# Patient Record
Sex: Male | Born: 1960 | Race: Black or African American | Hispanic: No | Marital: Married | Smoking: Current every day smoker
Health system: Southern US, Community
[De-identification: ages and names within clinical notes are randomized; demographics above are authoritative.]

## PROBLEM LIST (undated history)

## (undated) HISTORY — PX: HEMORRHOID SURGERY: SHX153

---

## 2005-07-18 ENCOUNTER — Emergency Department: Payer: Self-pay | Admitting: Emergency Medicine

## 2014-04-15 ENCOUNTER — Emergency Department: Payer: Self-pay | Admitting: Emergency Medicine

## 2014-04-15 LAB — CBC WITH DIFFERENTIAL/PLATELET
Basophil #: 0.1 10*3/uL (ref 0.0–0.1)
Basophil %: 0.8 %
EOS PCT: 1.5 %
Eosinophil #: 0.1 10*3/uL (ref 0.0–0.7)
HCT: 45.2 % (ref 40.0–52.0)
HGB: 14.6 g/dL (ref 13.0–18.0)
LYMPHS ABS: 1.9 10*3/uL (ref 1.0–3.6)
Lymphocyte %: 21.4 %
MCH: 32.7 pg (ref 26.0–34.0)
MCHC: 32.4 g/dL (ref 32.0–36.0)
MCV: 101 fL — AB (ref 80–100)
MONOS PCT: 8.9 %
Monocyte #: 0.8 x10 3/mm (ref 0.2–1.0)
NEUTROS ABS: 6.1 10*3/uL (ref 1.4–6.5)
Neutrophil %: 67.4 %
Platelet: 192 10*3/uL (ref 150–440)
RBC: 4.47 10*6/uL (ref 4.40–5.90)
RDW: 16.1 % — ABNORMAL HIGH (ref 11.5–14.5)
WBC: 9 10*3/uL (ref 3.8–10.6)

## 2014-04-15 LAB — URINALYSIS, COMPLETE
BILIRUBIN, UR: NEGATIVE
BLOOD: NEGATIVE
Bacteria: NONE SEEN
Glucose,UR: NEGATIVE mg/dL (ref 0–75)
Ketone: NEGATIVE
LEUKOCYTE ESTERASE: NEGATIVE
Nitrite: NEGATIVE
Ph: 5 (ref 4.5–8.0)
Protein: NEGATIVE
Specific Gravity: 1.017 (ref 1.003–1.030)

## 2014-04-15 LAB — COMPREHENSIVE METABOLIC PANEL
Albumin: 4.1 g/dL (ref 3.4–5.0)
Alkaline Phosphatase: 55 U/L (ref 46–116)
Anion Gap: 12 (ref 7–16)
BILIRUBIN TOTAL: 0.5 mg/dL (ref 0.2–1.0)
BUN: 3 mg/dL — ABNORMAL LOW (ref 7–18)
CREATININE: 0.81 mg/dL (ref 0.60–1.30)
Calcium, Total: 9.6 mg/dL (ref 8.5–10.1)
Chloride: 106 mmol/L (ref 98–107)
Co2: 23 mmol/L (ref 21–32)
EGFR (African American): >60
GLUCOSE: 104 mg/dL — AB (ref 65–99)
OSMOLALITY: 278 (ref 275–301)
POTASSIUM: 4.4 mmol/L (ref 3.5–5.1)
SGOT(AST): 26 U/L (ref 15–37)
SGPT (ALT): 15 U/L (ref 14–63)
SODIUM: 141 mmol/L (ref 136–145)
TOTAL PROTEIN: 7.4 g/dL (ref 6.4–8.2)

## 2014-04-15 LAB — TROPONIN I

## 2014-04-15 LAB — LIPASE, BLOOD: Lipase: 77 U/L (ref 73–393)

## 2016-08-13 ENCOUNTER — Encounter: Payer: Self-pay | Admitting: Emergency Medicine

## 2016-08-13 ENCOUNTER — Emergency Department
Admission: EM | Admit: 2016-08-13 | Discharge: 2016-08-13 | Disposition: A | Discharge status: Home / Self Care | Payer: BLUE CROSS/BLUE SHIELD | Attending: Emergency Medicine | Admitting: Emergency Medicine

## 2016-08-13 DIAGNOSIS — F17200 Nicotine dependence, unspecified, uncomplicated: Secondary | ICD-10-CM | POA: Insufficient documentation

## 2016-08-13 DIAGNOSIS — K047 Periapical abscess without sinus: Secondary | ICD-10-CM | POA: Diagnosis not present

## 2016-08-13 DIAGNOSIS — J029 Acute pharyngitis, unspecified: Secondary | ICD-10-CM | POA: Diagnosis present

## 2016-08-13 LAB — CBC WITH DIFFERENTIAL/PLATELET
Basophils Absolute: 0.1 10*3/uL (ref 0–0.1)
Basophils Relative: 1 %
Eosinophils Absolute: 0.1 10*3/uL (ref 0–0.7)
Eosinophils Relative: 1 %
HCT: 40.5 % (ref 40.0–52.0)
Hemoglobin: 13.5 g/dL (ref 13.0–18.0)
Lymphocytes Relative: 12 %
Lymphs Abs: 1.4 10*3/uL (ref 1.0–3.6)
MCH: 31.8 pg (ref 26.0–34.0)
MCHC: 33.3 g/dL (ref 32.0–36.0)
MCV: 95.4 fL (ref 80.0–100.0)
Monocytes Absolute: 0.9 10*3/uL (ref 0.2–1.0)
Monocytes Relative: 8 %
Neutro Abs: 9.4 10*3/uL — ABNORMAL HIGH (ref 1.4–6.5)
Neutrophils Relative %: 78 %
Platelets: 217 10*3/uL (ref 150–440)
RBC: 4.25 MIL/uL — ABNORMAL LOW (ref 4.40–5.90)
RDW: 16 % — ABNORMAL HIGH (ref 11.5–14.5)
WBC: 11.8 10*3/uL — ABNORMAL HIGH (ref 3.8–10.6)

## 2016-08-13 LAB — COMPREHENSIVE METABOLIC PANEL
ALBUMIN: 4.3 g/dL (ref 3.5–5.0)
ALT: 13 U/L — ABNORMAL LOW (ref 17–63)
AST: 20 U/L (ref 15–41)
Alkaline Phosphatase: 50 U/L (ref 38–126)
Anion gap: 9 (ref 5–15)
BILIRUBIN TOTAL: 1 mg/dL (ref 0.3–1.2)
BUN: 9 mg/dL (ref 6–20)
CO2: 24 mmol/L (ref 22–32)
Calcium: 9 mg/dL (ref 8.9–10.3)
Chloride: 103 mmol/L (ref 101–111)
Creatinine, Ser: 0.8 mg/dL (ref 0.61–1.24)
GFR calc Af Amer: >60 mL/min (ref >60)
Glucose, Bld: 83 mg/dL (ref 65–99)
POTASSIUM: 4.2 mmol/L (ref 3.5–5.1)
Sodium: 136 mmol/L (ref 135–145)
TOTAL PROTEIN: 7.5 g/dL (ref 6.5–8.1)

## 2016-08-13 LAB — POCT RAPID STREP A: Streptococcus, Group A Screen (Direct): NEGATIVE

## 2016-08-13 MED ORDER — PENICILLIN V POTASSIUM 500 MG PO TABS: 500.0000 mg | ORAL_TABLET | Freq: Four times a day (QID) | ORAL | 0 refills | Status: DC

## 2016-08-13 MED ORDER — SODIUM CHLORIDE 0.9 % IV SOLN
3.0000 g | Freq: Once | INTRAVENOUS | Status: AC
  Administered 2016-08-13: 3 g via INTRAVENOUS
  Filled 2016-08-13: qty 3

## 2016-08-13 MED ORDER — TRAMADOL HCL 50 MG PO TABS: 50.0000 mg | ORAL_TABLET | Freq: Four times a day (QID) | ORAL | 0 refills | Status: AC | PRN

## 2016-08-13 NOTE — Discharge Instructions (Signed)
Been taking antibiotics as directed. Pen-Vee K 4 times a day for the next 7 days. Tramadol every 6 hours as needed for pain. You may also take Tylenol or ibuprofen as needed for pain and inflammation. Tuesday call to make an appointment with a dentist of your choice.

## 2016-08-13 NOTE — ED Provider Notes (Signed)
[facility] Emergency Department Provider Note ____________________________________________  Time seen: 8:40 AM  I have reviewed the triage vital signs and the nursing notes.  HISTORY  Chief Complaint  Sore Throat   HPI Johnathan Fowler is a 56 y.o. male is here with complaint of sore throat for the last 3 days. Patient states he has not taken anything over-the-counter and that today his throat is much worse. He also cannot open his mouth due to pain. He is unaware of any fever or chills. He states he is aware that he has a tooth that is bothering him but his throat is hurting worse. He still continues to drink fluids without any difficulty. Eating has become difficult study secondary to pain. Currently he rates his pain as 9/10.   History reviewed. No pertinent past medical history.  There are no active problems to display for this patient.   History reviewed. No pertinent surgical history.  Prior to Admission medications   Medication Sig Start Date End Date Taking? Authorizing Provider  penicillin v potassium (VEETID) 500 MG tablet Take 1 tablet (500 mg total) by mouth 4 (four) times daily. 08/13/16   Tommi Rumps, PA-C  traMADol (ULTRAM) 50 MG tablet Take 1 tablet (50 mg total) by mouth every 6 (six) hours as needed. 08/13/16 08/13/17  Tommi Rumps, PA-C    Allergies Patient has no known allergies.  History reviewed. No pertinent family history.  Social History Social History  Substance Use Topics  . Smoking status: Current Every Day Smoker  . Smokeless tobacco: Never Used  . Alcohol use Yes    Review of Systems  Constitutional: Negative for fever. Eyes: Negative for visual changes. ENT: Positive for sore throat. Positive for dental pain. Cardiovascular: Negative for chest pain. Respiratory: Negative for shortness of breath. Musculoskeletal: Negative for back pain. Skin: Negative for rash. Neurological: Negative for headaches, focal  weakness or numbness. ____________________________________________  PHYSICAL EXAM:  VITAL SIGNS: ED Triage Vitals [08/13/16 0821]  Enc Vitals Group     BP (!) 130/92     Pulse Rate 92     Resp 18     Temp 99.2 F (37.3 C)     Temp Source Oral     SpO2 100 %     Weight 120 lb (54.4 kg)     Height 5\' 11"  (1.803 m)     Head Circumference      Peak Flow      Pain Score 9     Pain Loc      Pain Edu?      Excl. in GC?     Constitutional: Alert and oriented. Well appearing and in no distress. Head: Normocephalic and atraumatic. Eyes: Conjunctivae are normal.  Ears: Canals clear. TMs intact bilaterally. Nose: No congestion Mouth/Throat: Mucous membranes are moist. Right lower posterior molar and gum surrounding is edematous. Patient is unable to open his mouth fully secondary to pain. No tonsillar enlargement is seen and uvula is midline. Neck: Supple.  Hematological/Lymphatic/Immunological: No cervical lymphadenopathy. Cardiovascular: Normal rate, regular rhythm. Normal distal pulses. Respiratory: Normal respiratory effort. No wheezes/rales/rhonchi. Musculoskeletal: Nontender with normal range of motion in all extremities.  Neurologic:  Normal gait without ataxia. Normal speech and language. No gross focal neurologic deficits are appreciated. Skin:  Skin is warm, dry and intact. No rash noted. Psychiatric: Mood and affect are normal. Patient exhibits appropriate insight and judgment.     INITIAL IMPRESSION / ASSESSMENT AND PLAN / ED COURSE  Patient improved after getting Unasyn 3 g IV and was able to open his mouth with less difficulty. Dr. Mayford KnifeWilliams was in to see the patient also and at that time there was active purulent drainage from the right posterior lower molar on the medial aspect. Patient was discharged with a prescription for tramadol 1 every 6 hours as needed for pain. Pen-Vee K 500 mg 4 times a day for 7 days. He is to follow-up with a dentist.     ____________________________________________  FINAL CLINICAL IMPRESSION(S) / ED DIAGNOSES  Final diagnoses:  Dental abscess     Tommi RumpsSummers, Rhonda L, PA-C 08/13/16 1339    Arnaldo NatalMalinda, Paul F, MD 08/13/16 [phone]

## 2016-08-13 NOTE — ED Triage Notes (Signed)
Pt to ed with c/o sore throat x 3 days.  

## 2016-08-13 NOTE — ED Notes (Signed)
See triage note   States he developed sore throat 3 days ago  Increased pain with swallowing  Also now has pain to right gumline since last pm..afebrile on arrival

## 2016-08-13 NOTE — ED Notes (Signed)
D&C IV 

## 2017-02-24 ENCOUNTER — Emergency Department
Admission: EM | Admit: 2017-02-24 | Discharge: 2017-02-24 | Disposition: A | Discharge status: Home / Self Care | Payer: BLUE CROSS/BLUE SHIELD | Attending: Emergency Medicine | Admitting: Emergency Medicine

## 2017-02-24 ENCOUNTER — Other Ambulatory Visit: Payer: Self-pay

## 2017-02-24 ENCOUNTER — Encounter: Payer: Self-pay | Admitting: Emergency Medicine

## 2017-02-24 DIAGNOSIS — F17200 Nicotine dependence, unspecified, uncomplicated: Secondary | ICD-10-CM | POA: Diagnosis not present

## 2017-02-24 DIAGNOSIS — L139 Bullous disorder, unspecified: Secondary | ICD-10-CM | POA: Diagnosis not present

## 2017-02-24 DIAGNOSIS — R21 Rash and other nonspecific skin eruption: Secondary | ICD-10-CM | POA: Diagnosis present

## 2017-02-24 MED ORDER — CEPHALEXIN 500 MG PO CAPS
500.0000 mg | ORAL_CAPSULE | Freq: Four times a day (QID) | ORAL | 0 refills | Status: DC
Start: 2017-02-24 — End: 2017-10-29

## 2017-02-24 MED ORDER — CLOBETASOL PROP EMOLLIENT BASE 0.05 % EX CREA: 1.0000 "application " | TOPICAL_CREAM | Freq: Two times a day (BID) | CUTANEOUS | 2 refills | Status: DC

## 2017-02-24 NOTE — ED Provider Notes (Signed)
[facility] Emergency Department Provider Note  ____________________________________________  Time seen: Approximately 4:19 PM  I have reviewed the triage vital signs and the nursing notes.   HISTORY  Chief Complaint Rash    HPI Johnathan Fowler is a 56 y.o. male who presents emergency department complaining of multiple "bites" to his left foot, and back.  Patient reports that he first noticed these areas 3 days ago.  He states that initially developed large blisters, that ruptures, but drains.  He has several areas that have opened, and are crusty.  Patient denies any history of same.  Patient reports that he is staying in a boardinghouse that he believes has bedbugs.  Patient has not seen any bedbugs, bites her only to the left foot and back.  He has not tried any medications prior to arrival.  He denies any headache, neck pain, neck stiffness, chest pain, shortness of breath, abdominal pain, nausea vomiting, diarrhea or constipation.  History reviewed. No pertinent past medical history.  There are no active problems to display for this patient.   History reviewed. No pertinent surgical history.  Prior to Admission medications   Medication Sig Start Date End Date Taking? Authorizing Provider  cephALEXin (KEFLEX) 500 MG capsule Take 1 capsule (500 mg total) by mouth 4 (four) times daily. 02/24/17   Everado Pillsbury, Delorise RoyalsJonathan D, PA-C  Clobetasol Prop Emollient Base (CLOBETASOL PROPIONATE E) 0.05 % emollient cream Apply 1 application topically 2 (two) times daily. 02/24/17   Avonte Sensabaugh, Delorise RoyalsJonathan D, PA-C  penicillin v potassium (VEETID) 500 MG tablet Take 1 tablet (500 mg total) by mouth 4 (four) times daily. 08/13/16   Tommi RumpsSummers, Rhonda L, PA-C  traMADol (ULTRAM) 50 MG tablet Take 1 tablet (50 mg total) by mouth every 6 (six) hours as needed. 08/13/16 08/13/17  Tommi RumpsSummers, Rhonda L, PA-C    Allergies Patient has no known allergies.  History reviewed. No pertinent family  history.  Social History Social History   Tobacco Use  . Smoking status: Current Every Day Smoker  . Smokeless tobacco: Never Used  Substance Use Topics  . Alcohol use: Yes  . Drug use: No     Review of Systems  Constitutional: No fever/chills Eyes: No visual changes. Cardiovascular: no chest pain. Respiratory: no cough. No SOB. Gastrointestinal: No abdominal pain.  No nausea, no vomiting.  No diarrhea.  No constipation. Musculoskeletal: Negative for musculoskeletal pain. Skin: Positive for blistering lesions that rupture and drain pustular material and leave scabbing lesions to left foot and back Neurological: Negative for headaches, focal weakness or numbness. 10-point ROS otherwise negative.  ____________________________________________   PHYSICAL EXAM:  VITAL SIGNS: ED Triage Vitals [02/24/17 1616]  Enc Vitals Group     BP      Pulse      Resp      Temp      Temp src      SpO2      Weight 130 lb (59 kg)     Height 5\' 11"  (1.803 m)     Head Circumference      Peak Flow      Pain Score 8     Pain Loc      Pain Edu?      Excl. in GC?      Constitutional: Alert and oriented. Well appearing and in no acute distress. Eyes: Conjunctivae are normal. PERRL. EOMI. Head: Atraumatic. Neck: No stridor.    Cardiovascular: Normal rate, regular rhythm. Normal S1 and S2.  Good  peripheral circulation. Respiratory: Normal respiratory effort without tachypnea or retractions. Lungs CTAB. Good air entry to the bases with no decreased or absent breath sounds. Musculoskeletal: Full range of motion to all extremities. No gross deformities appreciated. Neurologic:  Normal speech and language. No gross focal neurologic deficits are appreciated.  Skin:  Skin is warm, dry and intact. No rash noted.  Multiple bullous lesions noted to the dorsal aspect of the left foot.  4 have ruptured and are flat, one is draining, one is intact, one has left scarring/scabbing lesion.  No surrounding  cellulitis.  Patient's back has multiple stages of bullous lesions scattered from shoulders to buttocks.  A few scabbing lesions, a few draining pustular material, however most are in stages of bullous formation. Psychiatric: Mood and affect are normal. Speech and behavior are normal. Patient exhibits appropriate insight and judgement.   ____________________________________________   LABS (all labs ordered are listed, but only abnormal results are displayed)  Labs Reviewed - No data to display ____________________________________________  EKG   ____________________________________________  RADIOLOGY   No results found.  ____________________________________________    PROCEDURES  Procedure(s) performed:    Procedures    Medications - No data to display   ____________________________________________   INITIAL IMPRESSION / ASSESSMENT AND PLAN / ED COURSE  Pertinent labs & imaging results that were available during my care of the patient were reviewed by me and considered in my medical decision making (see chart for details).  Review of the [place] CSRS was performed in accordance of the [facility] prior to dispensing any controlled drugs.     Patient's diagnosis is consistent with bullous dermatitis.  Patient has symptoms and rash consistent with bullous impetigo versus bullous pemphigoid.  Patient has had no history of same.  No significant illness to trigger autoimmune condition at this time.  At this time, it is hard to determine whether this is pemphigoid versus impetigo based bullous formation.  As such, patient will be treated with both oral antibiotics as well as topical steroids.  If patient's symptoms resolve, likely impetigo based symptoms.  If the lesions do not resolve, it is likely pemphigoid and patient has been instructed to follow-up with dermatology for further non-biologic agents to control outbreak.. Patient will be discharged home with prescriptions for Keflex  and clobetasol topical. Patient is to follow up with dermatology as needed or otherwise directed. Patient is given ED precautions to return to the ED for any worsening or new symptoms.     ____________________________________________  FINAL CLINICAL IMPRESSION(S) / ED DIAGNOSES  Final diagnoses:  Bullous dermatitis      NEW MEDICATIONS STARTED DURING THIS VISIT:  ED Discharge Orders        Ordered    cephALEXin (KEFLEX) 500 MG capsule  4 times daily     02/24/17 1636    Clobetasol Prop Emollient Base (CLOBETASOL PROPIONATE E) 0.05 % emollient cream  2 times daily     02/24/17 1636          This chart was dictated using voice recognition software/Dragon. Despite best efforts to proofread, errors can occur which can change the meaning. Any change was purely unintentional.    Racheal PatchesCuthriell, Caly Pellum D, PA-C 02/24/17 1637    Sharman CheekStafford, Phillip, MD 02/27/17 [phone]
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
## 2017-10-29 ENCOUNTER — Other Ambulatory Visit: Payer: Self-pay

## 2017-10-29 ENCOUNTER — Emergency Department: Payer: BLUE CROSS/BLUE SHIELD

## 2017-10-29 ENCOUNTER — Emergency Department
Admission: EM | Admit: 2017-10-29 | Discharge: 2017-10-29 | Disposition: A | Discharge status: Home / Self Care | Payer: BLUE CROSS/BLUE SHIELD | Attending: Emergency Medicine | Admitting: Emergency Medicine

## 2017-10-29 ENCOUNTER — Encounter: Payer: Self-pay | Admitting: Emergency Medicine

## 2017-10-29 DIAGNOSIS — Y998 Other external cause status: Secondary | ICD-10-CM | POA: Insufficient documentation

## 2017-10-29 DIAGNOSIS — Y92410 Unspecified street and highway as the place of occurrence of the external cause: Secondary | ICD-10-CM | POA: Insufficient documentation

## 2017-10-29 DIAGNOSIS — S161XXA Strain of muscle, fascia and tendon at neck level, initial encounter: Secondary | ICD-10-CM | POA: Insufficient documentation

## 2017-10-29 DIAGNOSIS — S39012A Strain of muscle, fascia and tendon of lower back, initial encounter: Secondary | ICD-10-CM

## 2017-10-29 DIAGNOSIS — Y9389 Activity, other specified: Secondary | ICD-10-CM | POA: Insufficient documentation

## 2017-10-29 DIAGNOSIS — F17200 Nicotine dependence, unspecified, uncomplicated: Secondary | ICD-10-CM | POA: Diagnosis not present

## 2017-10-29 DIAGNOSIS — S199XXA Unspecified injury of neck, initial encounter: Secondary | ICD-10-CM | POA: Diagnosis present

## 2017-10-29 MED ORDER — KETOROLAC TROMETHAMINE 10 MG PO TABS: 10.0000 mg | ORAL_TABLET | Freq: Three times a day (TID) | ORAL | 0 refills | Status: DC

## 2017-10-29 MED ORDER — KETOROLAC TROMETHAMINE 30 MG/ML IJ SOLN
30.0000 mg | Freq: Once | INTRAMUSCULAR | Status: AC
  Administered 2017-10-29: 30 mg via INTRAMUSCULAR
  Filled 2017-10-29: qty 1

## 2017-10-29 MED ORDER — CYCLOBENZAPRINE HCL 5 MG PO TABS: 5.0000 mg | ORAL_TABLET | Freq: Three times a day (TID) | ORAL | 0 refills | Status: DC | PRN
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
## 2019-05-31 ENCOUNTER — Emergency Department: Payer: Self-pay

## 2019-05-31 ENCOUNTER — Other Ambulatory Visit: Payer: Self-pay

## 2019-05-31 ENCOUNTER — Encounter: Payer: Self-pay | Admitting: Emergency Medicine

## 2019-05-31 ENCOUNTER — Emergency Department
Admission: EM | Admit: 2019-05-31 | Discharge: 2019-05-31 | Disposition: A | Discharge status: Home / Self Care | Payer: Self-pay | Attending: Emergency Medicine | Admitting: Emergency Medicine

## 2019-05-31 DIAGNOSIS — F17200 Nicotine dependence, unspecified, uncomplicated: Secondary | ICD-10-CM | POA: Insufficient documentation

## 2019-05-31 DIAGNOSIS — M10071 Idiopathic gout, right ankle and foot: Secondary | ICD-10-CM | POA: Insufficient documentation

## 2019-05-31 LAB — CBC
HCT: 36.1 % — ABNORMAL LOW (ref 39.0–52.0)
Hemoglobin: 11.7 g/dL — ABNORMAL LOW (ref 13.0–17.0)
MCH: 32.5 pg (ref 26.0–34.0)
MCHC: 32.4 g/dL (ref 30.0–36.0)
MCV: 100.3 fL — ABNORMAL HIGH (ref 80.0–100.0)
Platelets: 202 10*3/uL (ref 150–400)
RBC: 3.6 MIL/uL — ABNORMAL LOW (ref 4.22–5.81)
RDW: 14.4 % (ref 11.5–15.5)
WBC: 8.6 10*3/uL (ref 4.0–10.5)
nRBC: 0 % (ref 0.0–0.2)

## 2019-05-31 LAB — URIC ACID: Uric Acid, Serum: 9.5 mg/dL — ABNORMAL HIGH (ref 3.7–8.6)

## 2019-05-31 LAB — SEDIMENTATION RATE: Sed Rate: 9 mm/hr (ref 0–20)

## 2019-05-31 MED ORDER — OXYCODONE-ACETAMINOPHEN 5-325 MG PO TABS
1.0000 | ORAL_TABLET | Freq: Once | ORAL | Status: AC
  Administered 2019-05-31: 1 via ORAL
  Filled 2019-05-31: qty 1

## 2019-05-31 MED ORDER — INDOMETHACIN 50 MG PO CAPS: 50.0000 mg | ORAL_CAPSULE | Freq: Three times a day (TID) | ORAL | 1 refills | Status: DC

## 2019-05-31 MED ORDER — IBUPROFEN 600 MG PO TABS
600.0000 mg | ORAL_TABLET | Freq: Once | ORAL | Status: AC
  Administered 2019-05-31: 600 mg via ORAL
  Filled 2019-05-31: qty 1

## 2019-05-31 MED ORDER — TRAMADOL HCL 50 MG PO TABS: 50.0000 mg | ORAL_TABLET | Freq: Four times a day (QID) | ORAL | 0 refills | Status: DC | PRN

## 2019-05-31 MED ORDER — LIDOCAINE 5 % EX PTCH
1.0000 | MEDICATED_PATCH | Freq: Once | CUTANEOUS | Status: DC
  Administered 2019-05-31: 1 via TRANSDERMAL
  Filled 2019-05-31: qty 1

## 2019-05-31 NOTE — ED Provider Notes (Signed)
[facility] Emergency Department Provider Note   ____________________________________________   First MD Initiated Contact with Patient 05/31/19 1237     (approximate)  I have reviewed the triage vital signs and the nursing notes.   HISTORY  Chief Complaint Foot Pain    HPI Johnathan Fowler is a 59 y.o. male patient presents with acute onset of right great toe pain which started yesterday afternoon.  No provocative incident for complaint.  Patient rates pain a 10/10.  Patient described the pain as "achy".  No palliative measure for complaint.         History reviewed. No pertinent past medical history.  There are no problems to display for this patient.   History reviewed. No pertinent surgical history.  Prior to Admission medications   Medication Sig Start Date End Date Taking? Authorizing Provider  indomethacin (INDOCIN) 50 MG capsule Take 1 capsule (50 mg total) by mouth 3 (three) times daily with meals. 05/31/19   Sable Feil, PA-C  traMADol (ULTRAM) 50 MG tablet Take 1 tablet (50 mg total) by mouth every 6 (six) hours as needed for moderate pain. 05/31/19   Sable Feil, PA-C    Allergies Patient has no known allergies.  No family history on file.  Social History Social History   Tobacco Use  . Smoking status: Current Every Day Smoker  . Smokeless tobacco: Never Used  Substance Use Topics  . Alcohol use: Yes  . Drug use: No    Review of Systems Constitutional: No fever/chills Eyes: No visual changes. ENT: No sore throat. Cardiovascular: Denies chest pain. Respiratory: Denies shortness of breath. Gastrointestinal: No abdominal pain.  No nausea, no vomiting.  No diarrhea.  No constipation. Genitourinary: Negative for dysuria. Musculoskeletal: Right great toe pain. Skin: Negative for rash.  ____________________________________________   PHYSICAL EXAM:  VITAL SIGNS: ED Triage Vitals  Enc Vitals Group     BP    Pulse      Resp      Temp      Temp src      SpO2      Weight      Height      Head Circumference      Peak Flow      Pain Score      Pain Loc      Pain Edu?      Excl. in [place]?     Constitutional: Alert and oriented. Well appearing and in no acute distress. Cardiovascular: Normal rate, regular rhythm. Grossly normal heart sounds.  Good peripheral circulation. Respiratory: Normal respiratory effort.  No retractions. Lungs CTAB. Musculoskeletal: No lower extremity tenderness nor edema.  No joint effusions. Neurologic:  Normal speech and language. No gross focal neurologic deficits are appreciated. No gait instability. Skin:  Skin is warm, dry and intact. No rash noted. Psychiatric: Mood and affect are normal. Speech and behavior are normal.  ____________________________________________   LABS (all labs ordered are listed, but only abnormal results are displayed)  Labs Reviewed  URIC ACID - Abnormal; Notable for the following components:      Result Value   Uric Acid, Serum 9.5 (*)    All other components within normal limits  CBC - Abnormal; Notable for the following components:   RBC 3.60 (*)    Hemoglobin 11.7 (*)    HCT 36.1 (*)    MCV 100.3 (*)    All other components within normal limits  SEDIMENTATION RATE   ____________________________________________  EKG   ____________________________________________  RADIOLOGY  ED MD interpretation:   Official radiology report(s): DG Toe Great Right  Result Date: 05/31/2019 CLINICAL DATA:  Right toe pain and swelling. No known injury EXAM: RIGHT GREAT TOE COMPARISON:  None. FINDINGS: There is no evidence of fracture or dislocation. No erosion. There is no evidence of arthropathy or other focal bone abnormality. Mild soft tissue prominence overlying the medial aspect of the first MTP joint. IMPRESSION: Mild soft tissue prominence overlying the medial aspect of the first MTP joint. No erosion or other acute osseous  abnormality. Electronically Signed   By: Duanne Guess D.O.   On: 05/31/2019 13:11    ____________________________________________   PROCEDURES  Procedure(s) performed (including Critical Care):  Procedures   ____________________________________________   INITIAL IMPRESSION / ASSESSMENT AND PLAN / ED COURSE  As part of my medical decision making, I reviewed the following data within the electronic MEDICAL RECORD NUMBER     Patient present with acute onset of right great toe pain. Discussed lab and x-ray results with Patient. Patient given discharge care instructions and work note. Take medications as directed. Establish care with the [facility].    Johnathan Fowler was evaluated in Emergency Department on 05/31/2019 for the symptoms described in the history of present illness. He was evaluated in the context of the global COVID-19 pandemic, which necessitated consideration that the patient might be at risk for infection with the SARS-CoV-2 virus that causes COVID-19. Institutional protocols and algorithms that pertain to the evaluation of patients at risk for COVID-19 are in a state of rapid change based on information released by regulatory bodies including the CDC and federal and state organizations. These policies and algorithms were followed during the patient's care in the ED.       ____________________________________________   FINAL CLINICAL IMPRESSION(S) / ED DIAGNOSES  Final diagnoses:  Acute idiopathic gout involving toe of right foot     ED Discharge Orders         Ordered    indomethacin (INDOCIN) 50 MG capsule  3 times daily with meals     05/31/19 1406    traMADol (ULTRAM) 50 MG tablet  Every 6 hours PRN     05/31/19 1406           Note:  This document was prepared using Dragon voice recognition software and may include unintentional dictation errors.    Joni Reining, PA-C 05/31/19 1412    Concha Se, MD 06/01/19 [phone]

## 2019-05-31 NOTE — ED Triage Notes (Signed)
Presents with right foot pain  Developed pain last pm  Redness and swelling noted at great toe

## 2019-05-31 NOTE — Discharge Instructions (Addendum)
Follow discharge care instruction take medication as directed. °

## 2019-06-03 NOTE — ED Notes (Signed)
Per Dr. Mayford Knife, extend work note from 3/15-3/17.

## 2019-09-17 ENCOUNTER — Telehealth: Payer: Self-pay | Admitting: General Practice

## 2019-09-17 NOTE — Telephone Encounter (Signed)
Individual has been contacted 3+ times regarding ED referral and has been given information regarding how to become a pt. No further attempts to contact individual will be made. 

## 2019-12-14 ENCOUNTER — Emergency Department
Admission: EM | Admit: 2019-12-14 | Discharge: 2019-12-14 | Disposition: A | Discharge status: Home / Self Care | Payer: Self-pay | Attending: Emergency Medicine | Admitting: Emergency Medicine

## 2019-12-14 ENCOUNTER — Emergency Department: Payer: Self-pay

## 2019-12-14 ENCOUNTER — Other Ambulatory Visit: Payer: Self-pay

## 2019-12-14 DIAGNOSIS — F17200 Nicotine dependence, unspecified, uncomplicated: Secondary | ICD-10-CM | POA: Insufficient documentation

## 2019-12-14 DIAGNOSIS — R0789 Other chest pain: Secondary | ICD-10-CM | POA: Insufficient documentation

## 2019-12-14 LAB — BASIC METABOLIC PANEL
Anion gap: 7 (ref 5–15)
BUN: 5 mg/dL — ABNORMAL LOW (ref 6–20)
CO2: 26 mmol/L (ref 22–32)
Calcium: 9.1 mg/dL (ref 8.9–10.3)
Chloride: 106 mmol/L (ref 98–111)
Creatinine, Ser: 0.75 mg/dL (ref 0.61–1.24)
GFR calc Af Amer: >60 mL/min (ref >60)
GFR calc non Af Amer: >60 mL/min (ref >60)
Glucose, Bld: 72 mg/dL (ref 70–99)
Potassium: 3.7 mmol/L (ref 3.5–5.1)
Sodium: 139 mmol/L (ref 135–145)

## 2019-12-14 LAB — CBC
HCT: 38.2 % — ABNORMAL LOW (ref 39.0–52.0)
Hemoglobin: 12.6 g/dL — ABNORMAL LOW (ref 13.0–17.0)
MCH: 31.2 pg (ref 26.0–34.0)
MCHC: 33 g/dL (ref 30.0–36.0)
MCV: 94.6 fL (ref 80.0–100.0)
Platelets: 164 10*3/uL (ref 150–400)
RBC: 4.04 MIL/uL — ABNORMAL LOW (ref 4.22–5.81)
RDW: 15.9 % — ABNORMAL HIGH (ref 11.5–15.5)
WBC: 7.6 10*3/uL (ref 4.0–10.5)
nRBC: 0 % (ref 0.0–0.2)

## 2019-12-14 LAB — TROPONIN I (HIGH SENSITIVITY)
Troponin I (High Sensitivity): 4 ng/L (ref <18)
Troponin I (High Sensitivity): 3 ng/L (ref <18)

## 2019-12-14 MED ORDER — KETOROLAC TROMETHAMINE 30 MG/ML IJ SOLN
30.0000 mg | Freq: Once | INTRAMUSCULAR | Status: AC
  Administered 2019-12-14: 19:00:00 30 mg via INTRAMUSCULAR
  Filled 2019-12-14: qty 1

## 2019-12-14 MED ORDER — ACETAMINOPHEN 500 MG PO TABS
1000.0000 mg | ORAL_TABLET | Freq: Once | ORAL | Status: AC
  Administered 2019-12-14: 19:00:00 1000 mg via ORAL
  Filled 2019-12-14: qty 2

## 2019-12-14 NOTE — ED Provider Notes (Signed)
[facility] Emergency Department Provider Note ____________________________________________   First MD Initiated Contact with Patient 12/14/19 1856     (approximate)  I have reviewed the triage vital signs and the nursing notes.  HISTORY  Chief Complaint Chest Pain   HPI Johnathan Fowler is a 59 y.o. malewho presents to the ED for evaluation of chest pain.  Chart review indicates history of gout.  No relevant medical history.  No cardiac history.  Patient reports about 4 days of constant left-sided chest pain.  He reports pain is 7/10 intensity and constant to his left chest, transiently improved with home Tylenol.  Nonradiating.  Aching in nature.  Denies any inciting injuries, denies overlying rashes.  Denies any associated vomiting, nausea, diaphoresis, syncope, shortness of breath, cough.    History reviewed. No pertinent past medical history.  There are no problems to display for this patient.   History reviewed. No pertinent surgical history.  Prior to Admission medications   Medication Sig Start Date End Date Taking? Authorizing Provider  indomethacin (INDOCIN) 50 MG capsule Take 1 capsule (50 mg total) by mouth 3 (three) times daily with meals. 05/31/19   Joni Reining, PA-C  traMADol (ULTRAM) 50 MG tablet Take 1 tablet (50 mg total) by mouth every 6 (six) hours as needed for moderate pain. 05/31/19   Joni Reining, PA-C    Allergies Patient has no known allergies.  History reviewed. No pertinent family history.  Social History Social History   Tobacco Use  . Smoking status: Current Every Day Smoker  . Smokeless tobacco: Never Used  Substance Use Topics  . Alcohol use: Yes  . Drug use: No    Review of Systems  Constitutional: No fever/chills Eyes: No visual changes. ENT: No sore throat. Cardiovascular: Positive for chest pain. Respiratory: Denies shortness of breath. Gastrointestinal: No abdominal pain.  No nausea, no  vomiting.  No diarrhea.  No constipation. Genitourinary: Negative for dysuria. Musculoskeletal: Negative for back pain. Skin: Negative for rash. Neurological: Negative for headaches, focal weakness or numbness.  ____________________________________________   PHYSICAL EXAM:  VITAL SIGNS: Vitals:   12/14/19 1915 12/14/19 1930  BP: (!) 151/79 (!) 141/84  Pulse:  69  Resp:  18  Temp:    SpO2:  100%      Constitutional: Alert and oriented. Well appearing and in no acute distress. Eyes: Conjunctivae are normal. PERRL. EOMI. Head: Atraumatic. Nose: No congestion/rhinnorhea. Mouth/Throat: Mucous membranes are moist.  Oropharynx non-erythematous. Neck: No stridor. No cervical spine tenderness to palpation. Cardiovascular: Normal rate, regular rhythm. Grossly normal heart sounds.  Good peripheral circulation. Respiratory: Normal respiratory effort.  No retractions. Lungs CTAB. Gastrointestinal: Soft , nondistended, nontender to palpation. No abdominal bruits. No CVA tenderness. Musculoskeletal: No lower extremity tenderness nor edema.  No joint effusions. No signs of acute trauma. Left-sided chest pain anteriorly is reproducible by palpation.  No overlying skin changes or signs of trauma. Neurologic:  Normal speech and language. No gross focal neurologic deficits are appreciated. No gait instability noted. Skin:  Skin is warm, dry and intact. No rash noted. Psychiatric: Mood and affect are normal. Speech and behavior are normal.  ____________________________________________   LABS (all labs ordered are listed, but only abnormal results are displayed)  Labs Reviewed  BASIC METABOLIC PANEL - Abnormal; Notable for the following components:      Result Value   BUN 5 (*)    All other components within normal limits  CBC - Abnormal; Notable for  the following components:   RBC 4.04 (*)    Hemoglobin 12.6 (*)    HCT 38.2 (*)    RDW 15.9 (*)    All other components within normal  limits  TROPONIN I (HIGH SENSITIVITY)  TROPONIN I (HIGH SENSITIVITY)   ____________________________________________  12 Lead EKG  Sinus rhythm, rate of 91 bpm.  Normal axis and intervals.  No evidence of acute ischemia. ____________________________________________  RADIOLOGY  ED MD interpretation: 2 view CXR reviewed without evidence of acute cardiopulmonary pathology.  Official radiology report(s): DG Chest 2 View  Result Date: 12/14/2019 CLINICAL DATA:  Chest pain for several days EXAM: CHEST - 2 VIEW COMPARISON:  None. FINDINGS: The heart size and mediastinal contours are within normal limits. Both lungs are clear. The visualized skeletal structures are unremarkable. IMPRESSION: No active cardiopulmonary disease. Electronically Signed   By: Alcide Clever M.D.   On: 12/14/2019 13:35    ____________________________________________   PROCEDURES and INTERVENTIONS  Procedure(s) performed (including Critical Care):  Procedures  Medications  acetaminophen (TYLENOL) tablet 1,000 mg (1,000 mg Oral Given 12/14/19 1923)  ketorolac (TORADOL) 30 MG/ML injection 30 mg (30 mg Intramuscular Given 12/14/19 1924)    ____________________________________________   MDM / ED COURSE  59 year old man without cardiac history presents with few days of constant chest pain, most consistent with muscular strain/spasm, without evidence of ACS and amenable to outpatient management.  Normal vital signs on room air.  Exam demonstrates reproducible chest pain on palpation without overlying skin changes or signs of trauma.  He is otherwise well-appearing without distress or signs of pathology.  Blood work is reassuring without acute derangements.  Nonischemic EKG and negative troponin x2.  CXR without pathology.  No evidence of shingles or ACS.  Patient has resolution of symptoms after Toradol administration.  I see no indications for further work-up in the ED or hospitalization.  Urged the patient to follow-up  with his PCP to discuss outpatient cardiac stress testing.  We discussed return precautions for the ED.  Patient medically stable for discharge home.  Clinical Course as of Dec 14 2022  Sat Dec 14, 2019  1959 Reassessed.  Patient with resolution of symptoms.  We discussed outpatient management and return precautions for the ED.  Answered questions.   [DS]    Clinical Course User Index [DS] Delton Prairie, MD     ____________________________________________   FINAL CLINICAL IMPRESSION(S) / ED DIAGNOSES  Final diagnoses:  Other chest pain     ED Discharge Orders    None       Aadvik Roker Katrinka Blazing   Note:  This document was prepared using Dragon voice recognition software and may include unintentional dictation errors.   Delton Prairie, MD 12/14/19 2025

## 2019-12-14 NOTE — ED Triage Notes (Signed)
Pt comes POV for CP in center and left chest. Denies radiation. Pt has HTN. Pain started Tuesday and states that the pain has been getting worse.

## 2019-12-14 NOTE — ED Notes (Addendum)
Patient walked out without discharge instructions. Dr. Katrinka Blazing aware.

## 2019-12-14 NOTE — Discharge Instructions (Signed)
Please take Tylenol and ibuprofen/Advil for your pain.  It is safe to take them together, or to alternate them every few hours.  Take up to 1000mg  of Tylenol at a time, up to 4 times per day.  Do not take more than 4000 mg of Tylenol in 24 hours.  For ibuprofen, take 400-600 mg, 4-5 times per day.  If you develop any worsening chest pain despite these medications, especially in combination with fevers, throwing up or passing out, please return to the ED.

## 2020-01-13 ENCOUNTER — Emergency Department
Admission: EM | Admit: 2020-01-13 | Discharge: 2020-01-13 | Disposition: A | Discharge status: Home / Self Care | Payer: Self-pay | Attending: Emergency Medicine | Admitting: Emergency Medicine

## 2020-01-13 ENCOUNTER — Other Ambulatory Visit: Payer: Self-pay

## 2020-01-13 ENCOUNTER — Encounter: Payer: Self-pay | Admitting: Emergency Medicine

## 2020-01-13 DIAGNOSIS — F17200 Nicotine dependence, unspecified, uncomplicated: Secondary | ICD-10-CM | POA: Insufficient documentation

## 2020-01-13 DIAGNOSIS — M109 Gout, unspecified: Secondary | ICD-10-CM | POA: Insufficient documentation

## 2020-01-13 MED ORDER — COLCHICINE 0.6 MG PO TABS: ORAL_TABLET | ORAL | 0 refills | Status: DC

## 2020-01-13 MED ORDER — HYDROCODONE-ACETAMINOPHEN 5-325 MG PO TABS: 1.0000 | ORAL_TABLET | Freq: Four times a day (QID) | ORAL | 0 refills | Status: AC | PRN

## 2020-01-13 NOTE — ED Triage Notes (Signed)
Pt presents to ED via POV, c/o R ankle pain, states "flared up about a month ago" with same pain. Pt ambulatory to triage with slight limp noted at this time. Pt A&O x4. Pt denies known injury, states pain re-started on Friday.

## 2020-01-13 NOTE — ED Provider Notes (Signed)
[facility] Emergency Department Provider Note ____________________________________________  Time seen: Approximately 3:00 PM  I have reviewed the triage vital signs and the nursing notes.   HISTORY  Chief Complaint Ankle Pain    HPI Johnathan Fowler is a 59 y.o. male who presents to the emergency department for evaluation and treatment of right ankle pain.  He states that approximately 1 month ago it "flared up" but got better without intervention.  Pain started again yesterday.  He does have a history of gout.  He says this is the same pain as he is experienced in the past with a gout flare.  No alleviating measures attempted prior to arrival.   History reviewed. No pertinent past medical history.  There are no problems to display for this patient.   History reviewed. No pertinent surgical history.  Prior to Admission medications   Medication Sig Start Date End Date Taking? Authorizing Provider  colchicine 0.6 MG tablet Take 2 tablets at onset of pain. If still hurting in 1 hour, take 1 tablet. No more than 3 tablets in a 24 hour period 01/13/20   Kem Boroughs B, FNP  HYDROcodone-acetaminophen (NORCO/VICODIN) 5-325 MG tablet Take 1 tablet by mouth every 6 (six) hours as needed for up to 3 days for severe pain. 01/13/20 01/16/20  Hazell Siwik, Rulon Eisenmenger B, FNP  indomethacin (INDOCIN) 50 MG capsule Take 1 capsule (50 mg total) by mouth 3 (three) times daily with meals. 05/31/19   Joni Reining, PA-C  traMADol (ULTRAM) 50 MG tablet Take 1 tablet (50 mg total) by mouth every 6 (six) hours as needed for moderate pain. 05/31/19   Joni Reining, PA-C    Allergies Patient has no known allergies.  History reviewed. No pertinent family history.  Social History Social History   Tobacco Use  . Smoking status: Current Every Day Smoker  . Smokeless tobacco: Never Used  Substance Use Topics  . Alcohol use: Yes  . Drug use: No    Review of Systems Constitutional:  Negative for fever. Cardiovascular: Negative for chest pain. Respiratory: Negative for shortness of breath. Musculoskeletal: Positive for right ankle pain. Skin: Positive for tenderness over the right ankle Neurological: Negative for decrease in sensation  ____________________________________________   PHYSICAL EXAM:  VITAL SIGNS: ED Triage Vitals  Enc Vitals Group     BP 01/13/20 1205 120/76     Pulse Rate 01/13/20 1205 79     Resp 01/13/20 1205 16     Temp 01/13/20 1205 98.6 F (37 C)     Temp Source 01/13/20 1205 Oral     SpO2 01/13/20 1205 98 %     Weight 01/13/20 1205 125 lb (56.7 kg)     Height 01/13/20 1205 5\' 11"  (1.803 m)     Head Circumference --      Peak Flow --      Pain Score 01/13/20 1210 8     Pain Loc --      Pain Edu? --      Excl. in [place]? --     Constitutional: Alert and oriented. Well appearing and in no acute distress. Eyes: Conjunctivae are clear without discharge or drainage Head: Atraumatic Neck: Supple Respiratory: No cough. Respirations are even and unlabored. Musculoskeletal: Focal tenderness, erythema, and increased skin temperature overlying the lateral malleolus of the left ankle. Neurologic: Motor and sensory function is intact. Skin: Increased skin temperature and erythema over the right lateral malleolus. Psychiatric: Affect and behavior are appropriate.  ____________________________________________  LABS (all labs ordered are listed, but only abnormal results are displayed)  Labs Reviewed - No data to display ____________________________________________  RADIOLOGY  Not indicated  I, Kem Boroughs, personally viewed and evaluated these images (plain radiographs) as part of my medical decision making, as well as reviewing the written report by the radiologist.  No results found. ____________________________________________   PROCEDURES  Procedures  ____________________________________________   INITIAL IMPRESSION /  ASSESSMENT AND PLAN / ED COURSE  Johnathan Fowler is a 59 y.o. who presents to the emergency department for treatment and evaluation of nontraumatic right ankle pain.  See HPI for further details.  Plan will be to treat him with colchicine and Norco.  Patient was encouraged to follow-up with his primary care provider or podiatrist for symptoms that do not improve with rest, elevation, and time.  He is to return to the emergency department for symptoms that change or worsen if unable to schedule an appointment.   Medications - No data to display  Pertinent labs & imaging results that were available during my care of the patient were reviewed by me and considered in my medical decision making (see chart for details).   _________________________________________   FINAL CLINICAL IMPRESSION(S) / ED DIAGNOSES  Final diagnoses:  Acute gout of right ankle, unspecified cause    ED Discharge Orders         Ordered    colchicine 0.6 MG tablet        01/13/20 1340    HYDROcodone-acetaminophen (NORCO/VICODIN) 5-325 MG tablet  Every 6 hours PRN        01/13/20 1340           If controlled substance prescribed during this visit, 12 month history viewed on the [facility] prior to issuing an initial prescription for Schedule II or III opiod.   Chinita Pester, FNP 01/13/20 1503    Minna Antis, MD 01/13/20 1536

## 2020-06-08 ENCOUNTER — Emergency Department
Admission: EM | Admit: 2020-06-08 | Discharge: 2020-06-08 | Disposition: A | Discharge status: Home / Self Care | Payer: Self-pay | Attending: Emergency Medicine | Admitting: Emergency Medicine

## 2020-06-08 ENCOUNTER — Emergency Department: Payer: Self-pay

## 2020-06-08 ENCOUNTER — Other Ambulatory Visit: Payer: Self-pay

## 2020-06-08 DIAGNOSIS — L0201 Cutaneous abscess of face: Secondary | ICD-10-CM | POA: Insufficient documentation

## 2020-06-08 DIAGNOSIS — F17200 Nicotine dependence, unspecified, uncomplicated: Secondary | ICD-10-CM | POA: Insufficient documentation

## 2020-06-08 DIAGNOSIS — H02846 Edema of left eye, unspecified eyelid: Secondary | ICD-10-CM | POA: Insufficient documentation

## 2020-06-08 LAB — CBC WITH DIFFERENTIAL/PLATELET
Abs Immature Granulocytes: 0.02 10*3/uL (ref 0.00–0.07)
Basophils Absolute: 0 10*3/uL (ref 0.0–0.1)
Basophils Relative: 0 %
Eosinophils Absolute: 0.1 10*3/uL (ref 0.0–0.5)
Eosinophils Relative: 1 %
HCT: 37.6 % — ABNORMAL LOW (ref 39.0–52.0)
Hemoglobin: 13.1 g/dL (ref 13.0–17.0)
Immature Granulocytes: 0 %
Lymphocytes Relative: 17 %
Lymphs Abs: 1.4 10*3/uL (ref 0.7–4.0)
MCH: 32.3 pg (ref 26.0–34.0)
MCHC: 34.8 g/dL (ref 30.0–36.0)
MCV: 92.8 fL (ref 80.0–100.0)
Monocytes Absolute: 0.9 10*3/uL (ref 0.1–1.0)
Monocytes Relative: 10 %
Neutro Abs: 6.2 10*3/uL (ref 1.7–7.7)
Neutrophils Relative %: 72 %
Platelets: 171 10*3/uL (ref 150–400)
RBC: 4.05 MIL/uL — ABNORMAL LOW (ref 4.22–5.81)
RDW: 15.7 % — ABNORMAL HIGH (ref 11.5–15.5)
WBC: 8.7 10*3/uL (ref 4.0–10.5)
nRBC: 0 % (ref 0.0–0.2)

## 2020-06-08 LAB — COMPREHENSIVE METABOLIC PANEL
ALT: 12 U/L (ref 0–44)
AST: 20 U/L (ref 15–41)
Albumin: 3.9 g/dL (ref 3.5–5.0)
Alkaline Phosphatase: 41 U/L (ref 38–126)
Anion gap: 6 (ref 5–15)
BUN: 6 mg/dL (ref 6–20)
CO2: 24 mmol/L (ref 22–32)
Calcium: 8.9 mg/dL (ref 8.9–10.3)
Chloride: 105 mmol/L (ref 98–111)
Creatinine, Ser: 0.78 mg/dL (ref 0.61–1.24)
GFR, Estimated: >60 mL/min (ref >60)
Glucose, Bld: 108 mg/dL — ABNORMAL HIGH (ref 70–99)
Potassium: 4.2 mmol/L (ref 3.5–5.1)
Sodium: 135 mmol/L (ref 135–145)
Total Bilirubin: 1.1 mg/dL (ref 0.3–1.2)
Total Protein: 7.1 g/dL (ref 6.5–8.1)

## 2020-06-08 MED ORDER — IOHEXOL 300 MG/ML  SOLN
75.0000 mL | Freq: Once | INTRAMUSCULAR | Status: AC | PRN
  Administered 2020-06-08: 75 mL via INTRAVENOUS
  Filled 2020-06-08: qty 75

## 2020-06-08 MED ORDER — HYDROCODONE-ACETAMINOPHEN 5-325 MG PO TABS: 1.0000 | ORAL_TABLET | Freq: Four times a day (QID) | ORAL | 0 refills | Status: DC | PRN

## 2020-06-08 MED ORDER — LIDOCAINE HCL (PF) 1 % IJ SOLN
5.0000 mL | Freq: Once | INTRAMUSCULAR | Status: AC
  Administered 2020-06-08: 5 mL
  Filled 2020-06-08: qty 5

## 2020-06-08 MED ORDER — SULFAMETHOXAZOLE-TRIMETHOPRIM 800-160 MG PO TABS: 1.0000 | ORAL_TABLET | Freq: Two times a day (BID) | ORAL | 0 refills | Status: DC

## 2020-06-08 MED ORDER — CLINDAMYCIN PHOSPHATE 600 MG/50ML IV SOLN
600.0000 mg | Freq: Once | INTRAVENOUS | Status: AC
  Administered 2020-06-08: 600 mg via INTRAVENOUS
  Filled 2020-06-08: qty 50

## 2020-06-08 NOTE — Discharge Instructions (Addendum)
Follow-up with your primary care provider if any continued problems or concerns.  Begin using warm moist compresses to the area frequently today at least 4-5 times.  Begin taking antibiotics that were sent to your pharmacy.  The hydrocodone is for pain.  Do not take additional Tylenol with this medication as it already contains it.  Take all of the antibiotic until completely finished.  Return to the emergency department if you develop a fever over 100.

## 2020-06-08 NOTE — ED Triage Notes (Addendum)
Pt comes with c/o left eye pain. 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 Pt states he believes it now has busted. No drainage noted. Pt denies any blurry vision.  Pt states drainage and pus. No current drainage noted.

## 2020-06-08 NOTE — ED Notes (Signed)
See triage note   Presents with swelling to left temporal area and left eye  States he has had a cyst to that location in past

## 2020-06-08 NOTE — ED Provider Notes (Signed)
[facility] Emergency Department Keylon Labelle Note  ____________________________________________   Event Date/Time   First MD Initiated Contact with Patient 06/08/20 [phone]     (approximate)  I have reviewed the triage vital signs and the nursing notes.   HISTORY  Chief Complaint Eye Pain   HPI CELVIN TANEY is a 60 y.o. male presents to the ED with left eye pain.  Patient states that he has had a cyst to the side of his left eye for approximately 10 years however over the weekend it became extremely tender and he noted some pus from the area but did not actually see actively drainage.  Now the area has become extremely tender and swollen.  He denies any visual changes, fever, chills or headache.  Currently rates his pain as 3 out of 10.       History reviewed. No pertinent past medical history.  There are no problems to display for this patient.   History reviewed. No pertinent surgical history.  Prior to Admission medications   Medication Sig Start Date End Date Taking? Authorizing Brahm Barbeau  HYDROcodone-acetaminophen (NORCO/VICODIN) 5-325 MG tablet Take 1 tablet by mouth every 6 (six) hours as needed. 06/08/20  Yes Bridget Hartshorn L, PA-C  sulfamethoxazole-trimethoprim (BACTRIM DS) 800-160 MG tablet Take 1 tablet by mouth 2 (two) times daily. 06/08/20  Yes Tommi Rumps, PA-C    Allergies Patient has no known allergies.  No family history on file.  Social History Social History   Tobacco Use  . Smoking status: Current Every Day Smoker  . Smokeless tobacco: Never Used  Substance Use Topics  . Alcohol use: Yes  . Drug use: No    Review of Systems Constitutional: No fever/chills Eyes: No visual changes. ENT: No complaints. Cardiovascular: Denies chest pain. Respiratory: Denies shortness of breath. Gastrointestinal: No abdominal pain.  No nausea, no vomiting.  Musculoskeletal: Negative for back pain. Skin: Positive for abscess left  face. Neurological: Negative for headaches, focal weakness or numbness. ____________________________________________   PHYSICAL EXAM:  VITAL SIGNS: ED Triage Vitals  Enc Vitals Group     BP 06/08/20 0717 (!) 146/80     Pulse Rate 06/08/20 0717 79     Resp 06/08/20 0717 19     Temp 06/08/20 0717 98.6 F (37 C)     Temp Source 06/08/20 0717 Axillary     SpO2 06/08/20 0717 100 %     Weight --      Height --      Head Circumference --      Peak Flow --      Pain Score 06/08/20 0713 3     Pain Loc --      Pain Edu? --      Excl. in GC? --     Constitutional: Alert and oriented. Well appearing and in no acute distress. Eyes: Conjunctivae are normal. PERRL. EOMI. left lateral face there is a small cyst that is markedly tender to palpation.  There is also edema noted in the infraorbital area that also is tender to palpation.  No active drainage at this time. Head: Atraumatic. Nose: No congestion/rhinnorhea. Neck: No stridor.   Cardiovascular: Normal rate, regular rhythm. Grossly normal heart sounds.  Good peripheral circulation. Respiratory: Normal respiratory effort.  No retractions. Lungs CTAB. Musculoskeletal: Moves upper and lower extremities without difficulty.  Normal gait was noted. Neurologic:  Normal speech and language. No gross focal neurologic deficits are appreciated. No gait instability. Skin:  Skin is warm,  dry.  Left facial area as noted above. Psychiatric: Mood and affect are normal. Speech and behavior are normal.  ____________________________________________   LABS (all labs ordered are listed, but only abnormal results are displayed)  Labs Reviewed  CBC WITH DIFFERENTIAL/PLATELET - Abnormal; Notable for the following components:      Result Value   RBC 4.05 (*)    HCT 37.6 (*)    RDW 15.7 (*)    All other components within normal limits  COMPREHENSIVE METABOLIC PANEL - Abnormal; Notable for the following components:   Glucose, Bld 108 (*)    All other  components within normal limits   ____________________________________________  RADIOLOGY I, Tommi Rumps, personally viewed and evaluated these images (plain radiographs) as part of my medical decision making, as well as reviewing the written report by the radiologist.   Official radiology report(s): CT Orbits W Contrast  Result Date: 06/08/2020 CLINICAL DATA:  Orbital cellulitis. EXAM: CT ORBITS WITH CONTRAST TECHNIQUE: Multidetector CT images was performed according to the standard protocol following intravenous contrast administration. CONTRAST:  34mL OMNIPAQUE IOHEXOL 300 MG/ML  SOLN COMPARISON:  None. FINDINGS: Orbits: Normal orbit bilaterally. No orbital edema or abscess. No orbital mass. Globe normal bilaterally. Periorbital soft tissue swelling around the left orbit described below. Visualized sinuses: Mild mucosal edema paranasal sinuses most notably right maxillary sinus. No air-fluid level. Skeletal: Poor dentition.  No acute bony abnormality. Soft tissues: Moderate periorbital edema on the left, lateral to the orbit and inferior to the orbit. Subcutaneous rim enhancing fluid collection lateral to the left orbit measures 15 x 9 mm may represent an abscess. Second small dermal lesion measuring approximately 3 mm in diameter inferior to the larger fluid collection, possible infection as well. Normal pharynx and upper neck. Limited intracranial: Negative IMPRESSION: Periorbital swelling on the left compatible with cellulitis. There is a rim enhancing fluid collection lateral to the left orbit measuring 15 x 9 mm, suspicious for dermal abscess. Second smaller dermal lesion inferior to the larger fluid collection may be a small area of infection as well. No evidence of orbital cellulitis. Electronically Signed   By: Marlan Palau M.D.   On: 06/08/2020 09:27    ____________________________________________   PROCEDURES  Procedure(s) performed (including Critical Care):  Marland KitchenMarland KitchenIncision and  Drainage  Date/Time: 06/08/2020 1:35 PM Performed by: Tommi Rumps, PA-C Authorized by: Tommi Rumps, PA-C   Consent:    Consent obtained:  Verbal   Consent given by:  Patient   Risks discussed:  Infection, incomplete drainage and pain Location:    Type:  Abscess   Location:  Head   Head location:  Face Anesthesia:    Anesthesia method:  Local infiltration   Local anesthetic:  Lidocaine 1% w/o epi Procedure type:    Complexity:  Simple Procedure details:    Ultrasound guidance: no     Needle aspiration: no     Incision types:  Single straight   Incision depth:  Dermal   Drainage:  Purulent   Drainage amount:  Moderate   Wound treatment:  Wound left open Post-procedure details:    Procedure completion:  Tolerated well, no immediate complications     ____________________________________________   INITIAL IMPRESSION / ASSESSMENT AND PLAN / ED COURSE  As part of my medical decision making, I reviewed the following data within the electronic MEDICAL RECORD NUMBER Notes from prior ED visits and [place] Controlled Substance Database  60 year old male presents to the ED with questionable abscess around his left.  Patient states that the bump itself has been there for 10 years but recently became inflamed and now very painful.  He is unaware of any fever and denies chills.  CT scan was done to evaluate a periorbital cellulitis.  There is small abscess noted on CT but no orbital involvement.  Patient was given clindamycin IV while in the ED.  Patient tolerated I&D of this area well.  A moderate amount of purulent drainage noted.  Patient was discharged with a prescription for Bactrim DS twice daily and hydrocodone as needed for pain.  He is encouraged to use warm moist compresses to the area frequently.  I encouraged him to follow-up with his primary care Trinisha Paget however he is aware that he can return to the emergency department if any severe worsening of his  symptoms.  ____________________________________________   FINAL CLINICAL IMPRESSION(S) / ED DIAGNOSES  Final diagnoses:  Facial abscess     ED Discharge Orders         Ordered    HYDROcodone-acetaminophen (NORCO/VICODIN) 5-325 MG tablet  Every 6 hours PRN        06/08/20 1219    sulfamethoxazole-trimethoprim (BACTRIM DS) 800-160 MG tablet  2 times daily        06/08/20 1219          *Please note:  CHESKEL SILVERIO was evaluated in Emergency Department on 06/08/2020 for the symptoms described in the history of present illness. He was evaluated in the context of the global COVID-19 pandemic, which necessitated consideration that the patient might be at risk for infection with the SARS-CoV-2 virus that causes COVID-19. Institutional protocols and algorithms that pertain to the evaluation of patients at risk for COVID-19 are in a state of rapid change based on information released by regulatory bodies including the CDC and federal and state organizations. These policies and algorithms were followed during the patient's care in the ED.  Some ED evaluations and interventions may be delayed as a result of limited staffing during and the pandemic.*   Note:  This document was prepared using Dragon voice recognition software and may include unintentional dictation errors.    Tommi Rumps, PA-C 06/08/20 1447    Jene Every, MD 06/08/20 1450

## 2020-10-15 ENCOUNTER — Emergency Department
Admission: EM | Admit: 2020-10-15 | Discharge: 2020-10-15 | Disposition: A | Discharge status: Home / Self Care | Payer: Self-pay | Attending: Emergency Medicine | Admitting: Emergency Medicine

## 2020-10-15 ENCOUNTER — Other Ambulatory Visit: Payer: Self-pay

## 2020-10-15 DIAGNOSIS — F17200 Nicotine dependence, unspecified, uncomplicated: Secondary | ICD-10-CM | POA: Insufficient documentation

## 2020-10-15 DIAGNOSIS — Z20822 Contact with and (suspected) exposure to covid-19: Secondary | ICD-10-CM | POA: Insufficient documentation

## 2020-10-15 DIAGNOSIS — R630 Anorexia: Secondary | ICD-10-CM | POA: Insufficient documentation

## 2020-10-15 LAB — RESP PANEL BY RT-PCR (FLU A&B, COVID) ARPGX2
Influenza A by PCR: NEGATIVE
Influenza B by PCR: NEGATIVE
SARS Coronavirus 2 by RT PCR: NEGATIVE

## 2020-10-15 NOTE — Discharge Instructions (Addendum)
Please activate your MyChart account with the instructions in this packet and obtain your COVID results from there.  Return to the ER if you have any severe worsening of symptoms, otherwise follow-up with primary care as needed.

## 2020-10-15 NOTE — ED Triage Notes (Signed)
Pt to ED for COVID test. 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  Pt states he has had decreased appetite but denies other sx.  Denies cp, dizziness, shob Ambulatory, NAD noted

## 2020-10-15 NOTE — ED Provider Notes (Signed)
[facility] Emergency Department Provider Note  ____________________________________________   Event Date/Time   First MD Initiated Contact with Patient 10/15/20 1451     (approximate)  I have reviewed the triage vital signs and the nursing notes.   HISTORY  Chief Complaint covid test   HPI Johnathan Fowler is a 60 y.o. male who reports to the emergency department for evaluation and needing a COVID test.  He rides in a company vehicle with a coworker who tested +2 days ago.  He denies any symptoms except for a mildly decreased appetite.  He denies chest pain, shortness of breath, cough, fever, chills, abdominal pain, nausea vomiting or diarrhea.  He states that he is here to be "checked out" and that his work requires a test.         History reviewed. No pertinent past medical history.  There are no problems to display for this patient.   History reviewed. No pertinent surgical history.  Prior to Admission medications   Medication Sig Start Date End Date Taking? Authorizing Provider  HYDROcodone-acetaminophen (NORCO/VICODIN) 5-325 MG tablet Take 1 tablet by mouth every 6 (six) hours as needed. 06/08/20   Tommi Rumps, PA-C  sulfamethoxazole-trimethoprim (BACTRIM DS) 800-160 MG tablet Take 1 tablet by mouth 2 (two) times daily. 06/08/20   Tommi Rumps, PA-C    Allergies Patient has no known allergies.  No family history on file.  Social History Social History   Tobacco Use   Smoking status: Every Day   Smokeless tobacco: Never  Substance Use Topics   Alcohol use: Yes   Drug use: No    Review of Systems Constitutional: No fever/chills Eyes: No visual changes. ENT: No sore throat. Cardiovascular: Denies chest pain. Respiratory: Denies shortness of breath. Gastrointestinal: No abdominal pain.  No nausea, no vomiting.  No diarrhea.  No constipation. Genitourinary: Negative for dysuria. Musculoskeletal: Negative for back  pain. Skin: Negative for rash. Neurological: Negative for headaches, focal weakness or numbness.  ____________________________________________   PHYSICAL EXAM:  VITAL SIGNS: ED Triage Vitals [10/15/20 1348]  Enc Vitals Group     BP 130/85     Pulse Rate 70     Resp 16     Temp 98.4 F (36.9 C)     Temp Source Oral     SpO2 100 %     Weight 125 lb (56.7 kg)     Height 5\' 11"  (1.803 m)     Head Circumference      Peak Flow      Pain Score 0     Pain Loc      Pain Edu?      Excl. in GC?    Constitutional: Alert and oriented. Well appearing and in no acute distress. Eyes: Conjunctivae are normal.  Head: Atraumatic. Mouth/Throat: Mucous membranes are moist.   Neck: No stridor.   Cardiovascular: Normal rate, regular rhythm. Grossly normal heart sounds.  Good peripheral circulation. Respiratory: Normal respiratory effort.  No retractions. Lungs CTAB.   ____________________________________________   PROCEDURES  Procedure(s) performed (including Critical Care):  Procedures   ____________________________________________   INITIAL IMPRESSION / ASSESSMENT AND PLAN / ED COURSE  As part of my medical decision making, I reviewed the following data within the electronic MEDICAL RECORD NUMBER Nursing notes reviewed and incorporated and Notes from prior ED visits        Patient is a 60 year old male who reports to the emergency department needing a COVID test after recent  exposure.  He reports mildly decreased appetite but denies any other symptoms.  He has both vaccinations and booster on board.  In triage patient has normal vital signs.  Physical exam as above, notably normal heart and lung sounds.  Will obtain COVID test, advised the patient on how to get these results through MyChart.  He stable this time for discharge, recommended PCP follow-up with any additional concerns.      ____________________________________________   FINAL CLINICAL IMPRESSION(S) / ED  DIAGNOSES  Final diagnoses:  Exposure to COVID-19 virus     ED Discharge Orders     None        Note:  This document was prepared using Dragon voice recognition software and may include unintentional dictation errors.    Lucy Chris, PA 10/15/20 1601    Delton Prairie, MD 10/15/20 [phone]

## 2023-02-07 ENCOUNTER — Emergency Department: Payer: Medicaid Other

## 2023-02-07 ENCOUNTER — Other Ambulatory Visit: Payer: Self-pay

## 2023-02-07 ENCOUNTER — Emergency Department
Admission: EM | Admit: 2023-02-07 | Discharge: 2023-02-07 | Disposition: A | Discharge status: Home / Self Care | Payer: Medicaid Other | Attending: Emergency Medicine | Admitting: Emergency Medicine

## 2023-02-07 ENCOUNTER — Encounter: Payer: Self-pay | Admitting: Emergency Medicine

## 2023-02-07 DIAGNOSIS — K4090 Unilateral inguinal hernia, without obstruction or gangrene, not specified as recurrent: Secondary | ICD-10-CM | POA: Insufficient documentation

## 2023-02-07 LAB — BASIC METABOLIC PANEL
Anion gap: 7 (ref 5–15)
BUN: 6 mg/dL — ABNORMAL LOW (ref 8–23)
CO2: 26 mmol/L (ref 22–32)
Calcium: 8.7 mg/dL — ABNORMAL LOW (ref 8.9–10.3)
Chloride: 101 mmol/L (ref 98–111)
Creatinine, Ser: 0.75 mg/dL (ref 0.61–1.24)
GFR, Estimated: >60 mL/min (ref >60)
Glucose, Bld: 93 mg/dL (ref 70–99)
Potassium: 3.7 mmol/L (ref 3.5–5.1)
Sodium: 134 mmol/L — ABNORMAL LOW (ref 135–145)

## 2023-02-07 LAB — CBC
HCT: 44.5 % (ref 39.0–52.0)
Hemoglobin: 15 g/dL (ref 13.0–17.0)
MCH: 33.7 pg (ref 26.0–34.0)
MCHC: 33.7 g/dL (ref 30.0–36.0)
MCV: 100 fL (ref 80.0–100.0)
Platelets: 225 10*3/uL (ref 150–400)
RBC: 4.45 MIL/uL (ref 4.22–5.81)
RDW: 14 % (ref 11.5–15.5)
WBC: 8.3 10*3/uL (ref 4.0–10.5)
nRBC: 0 % (ref 0.0–0.2)

## 2023-02-07 MED ORDER — IOHEXOL 300 MG/ML  SOLN
100.0000 mL | Freq: Once | INTRAMUSCULAR | Status: AC | PRN
  Administered 2023-02-07: 100 mL via INTRAVENOUS

## 2023-02-07 MED ORDER — IBUPROFEN 800 MG PO TABS
800.0000 mg | ORAL_TABLET | Freq: Once | ORAL | Status: AC
  Administered 2023-02-07: 800 mg via ORAL
  Filled 2023-02-07: qty 1

## 2023-02-07 MED ORDER — IBUPROFEN 800 MG PO TABS: 800.0000 mg | ORAL_TABLET | Freq: Three times a day (TID) | ORAL | 0 refills | Status: DC | PRN

## 2023-02-07 NOTE — ED Triage Notes (Signed)
Patient to ED via [facility] for hernia. Seen PCP for same- awaiting appointment with surgeon. Pt reporting pain with BM's. Pain ongoing for the past few months.

## 2023-02-07 NOTE — ED Provider Notes (Signed)
[facility] Emergency Department Provider Note     Event Date/Time   First MD Initiated Contact with Patient 02/07/23 1435     (approximate)   History   Hernia   HPI  Johnathan Fowler is a 62 y.o. male presents to the ED with complaint of a right inguinal hernia for a couple of months.  Patient reports increase in size and pain score 7/10.  Patient reports he has seen 4 different providers for this issue and is awaiting a surgical appointment.  Patient is reporting that he has noticed some change in bowel movements.  He endorses that he is still able to pass gas and move bowel. Denies fever and abdominal pain.     Physical Exam   Triage Vital Signs: ED Triage Vitals [02/07/23 1225]  Encounter Vitals Group     BP (!) 122/100     Systolic BP Percentile      Diastolic BP Percentile      Pulse Rate 86     Resp 18     Temp 98.2 F (36.8 C)     Temp Source Oral     SpO2 98 %     Weight 110 lb (49.9 kg)     Height 5\' 9"  (1.753 m)     Head Circumference      Peak Flow      Pain Score 7     Pain Loc      Pain Education      Exclude from Growth Chart     Most recent vital signs: Vitals:   02/07/23 1542 02/07/23 1737  BP: 124/88   Pulse: 90   Resp: 17   Temp: (!) 89 F (31.7 C) 98 F (36.7 C)  SpO2: 99%     General: Alert and oriented. INAD. Well-appearing  Skin:  Warm, dry and intact. No rashes or lesions noted.     Head:  NCAT.  Eyes:  PERRLA. EOMI.  CV:  Good peripheral perfusion. RRR.  RESP:  Normal effort. LCTAB.   ABD:  No distention. Soft, Non tender. Inguinal hernia noted on right side. Non-reducible. No discoloration on gangrene.  NEURO: Cranial nerves II-XII intact. No focal deficits.    ED Results / Procedures / Treatments   Labs (all labs ordered are listed, but only abnormal results are displayed) Labs Reviewed  BASIC METABOLIC PANEL - Abnormal; Notable for the following components:      Result Value   Sodium 134 (*)     BUN 6 (*)    Calcium 8.7 (*)    All other components within normal limits  CBC    RADIOLOGY  I personally viewed and evaluated these images as part of my medical decision making, as well as reviewing the written report by the radiologist.  ED Provider Interpretation: CT abdomen pelvis shows no obvious abnormality will confirm with final radiology read.  CT ABDOMEN PELVIS W CONTRAST  Result Date: 02/07/2023 CLINICAL DATA:  Pain with bowel movements, initial encounter EXAM: CT ABDOMEN AND PELVIS WITH CONTRAST TECHNIQUE: Multidetector CT imaging of the abdomen and pelvis was performed using the standard protocol following bolus administration of intravenous contrast. RADIATION DOSE REDUCTION: This exam was performed according to the departmental dose-optimization program which includes automated exposure control, adjustment of the mA and/or kV according to patient size and/or use of iterative reconstruction technique. CONTRAST:  OMNIPAQUE IOHEXOL 300 MG/ML  SOLN COMPARISON:  04/15/2014 FINDINGS: Lower chest: No acute abnormality. Hepatobiliary:  Fatty infiltration of the liver is noted. The gallbladder is decompressed. Pancreas: Unremarkable. No pancreatic ductal dilatation or surrounding inflammatory changes. Spleen: Normal in size without focal abnormality. Adrenals/Urinary Tract: Adrenal glands are within normal limits. Kidneys demonstrate a normal enhancement pattern bilaterally. No renal calculi or obstructive changes are seen. The bladder is decompressed. Stomach/Bowel: No obstructive or inflammatory changes of the colon are noted. The appendix is within normal limits. Small bowel and stomach are unremarkable. Vascular/Lymphatic: Aortic atherosclerosis. No enlarged abdominal or pelvic lymph nodes. Reproductive: Prostate is unremarkable. Other: No ascites is noted. Minimal fat containing right inguinal hernia is noted. The overall appearance is similar to 2016. Musculoskeletal: Mild  degenerative changes of lumbar spine are seen. IMPRESSION: Fatty liver. Minimal fat containing right inguinal hernia. No bowel is noted. No significant distortion is seen. Electronically Signed   By: Alcide Clever M.D.   On: 02/07/2023 21:07    PROCEDURES:  Critical Care performed: No  Procedures   MEDICATIONS ORDERED IN ED: Medications  iohexol (OMNIPAQUE) 300 MG/ML solution 100 mL (100 mLs Intravenous Contrast Given 02/07/23 1637)  ibuprofen (ADVIL) tablet 800 mg (800 mg Oral Given 02/07/23 1739)     IMPRESSION / MDM / ASSESSMENT AND PLAN / ED COURSE  I reviewed the triage vital signs and the nursing notes.                              Clinical Course as of 02/07/23 2216  Tue Feb 07, 2023  1825 Patient is weak and frustrated due to the extended.  Waiting.  He would like to be discharged.  Discussed with patient of waiting for the CT results to return for further appropriate disposition.  He does not want to wait any longer.  Physical exam shows a nonreducible hernia with no evidence of strangulation.  I have encouraged and advised patient to closely follow-up with surgeon tomorrow for further evaluation. [MH]    Clinical Course User Index [MH] Conrad [place]che, PA-C    62 y.o. male presents to the emergency department for evaluation and treatment of right inguinal hernia. See HPI for further details.   Differential diagnosis includes, but is not limited to inguinal hernia, strangulated hernia, bowel obstruction  Patient's presentation is most consistent with acute complicated illness / injury requiring diagnostic workup. Patient is alert and oriented.  He is hemodynamically stable and in no acute distress.  Physical exam findings are overall benign.  There is an obvious inguinal hernia noted on the right side that is non reducible.  I do not suspect strangulation given physical exam findings.  However given history of change in bowel movement a CT abdomen pelvis is obtained.  CT is  reassuring with no evidence of bowel obstruction.  Patient no longer would like to wait for final read on CT given extended wait time.  I encouraged close follow-up with a general surgery for further evaluation.  Patient verbalized understanding.  Patient does report some improvement with ibuprofen administered in ED.  This will be prescribed for outpatient use.  At this time patient is a stable condition for discharge home.  ED precautions discussed.  All questions and concerns were addressed during this ED visit.   FINAL CLINICAL IMPRESSION(S) / ED DIAGNOSES   Final diagnoses:  Non-recurrent inguinal hernia without obstruction or gangrene, unspecified laterality   Rx / DC Orders   ED Discharge Orders  Ordered    ibuprofen (ADVIL) 800 MG tablet  Every 8 hours PRN        02/07/23 1832             Note:  This document was prepared using Dragon voice recognition software and may include unintentional dictation errors.    Romeo Apple, Brok Stocking A, PA-C 02/07/23 2216    Trinna Post, MD 02/07/23 [phone]

## 2023-02-07 NOTE — Discharge Instructions (Signed)
You have to leave the ED before receiving final results of CT scan.  You will need to closely monitor your symptoms.  Do not lift heavy objects, do not strain with bowel movements and return to ED if you can no longer pass gas or bowel movements or increased in severity of pain.   Please call tomorrow the general surgeon listed above for further management.  Alternate taking Tylenol and ibuprofen for pain.  Apply ice to the affected area.

## 2023-02-07 NOTE — ED Notes (Signed)
Pt sts that he has a right sided inguinal hernia that is not reducing. Pt hernia is protruding and is not able to be reduced per pt. Pt denies any testicular pain or abd pain at this time.

## 2023-02-20 ENCOUNTER — Emergency Department
Admission: EM | Admit: 2023-02-20 | Discharge: 2023-02-20 | Disposition: A | Discharge status: Home / Self Care | Payer: Self-pay | Attending: Emergency Medicine | Admitting: Emergency Medicine

## 2023-02-20 ENCOUNTER — Other Ambulatory Visit: Payer: Self-pay

## 2023-02-20 DIAGNOSIS — K047 Periapical abscess without sinus: Secondary | ICD-10-CM | POA: Insufficient documentation

## 2023-02-20 DIAGNOSIS — F17210 Nicotine dependence, cigarettes, uncomplicated: Secondary | ICD-10-CM | POA: Insufficient documentation

## 2023-02-20 MED ORDER — CLINDAMYCIN HCL 300 MG PO CAPS: 300.0000 mg | ORAL_CAPSULE | Freq: Three times a day (TID) | ORAL | 0 refills | Status: AC

## 2023-02-20 NOTE — ED Provider Notes (Signed)
[facility] Provider Note    Event Date/Time   First MD Initiated Contact with Patient 02/20/23 1033     (approximate)   History   Oral Pain   HPI  Johnathan Fowler is a 62 y.o. male   presents to the ED with complaint of dental abscess and tasting "pus" in his mouth.  Patient was seen and prescribed amoxicillin several months ago which he took.  He states that he had a dental appointment but that was rescheduled.  Patient continues to smoke 1/2 pack of cigarettes per day.      Physical Exam   Triage Vital Signs: ED Triage Vitals  Encounter Vitals Group     BP 02/20/23 1015 (!) 140/90     Systolic BP Percentile --      Diastolic BP Percentile --      Pulse Rate 02/20/23 1015 69     Resp 02/20/23 1015 17     Temp 02/20/23 1015 97.8 F (36.6 C)     Temp src --      SpO2 02/20/23 1015 97 %     Weight 02/20/23 1014 110 lb (49.9 kg)     Height 02/20/23 1014 5\' 9"  (1.753 m)     Head Circumference --      Peak Flow --      Pain Score 02/20/23 1014 10     Pain Loc --      Pain Education --      Exclude from Growth Chart --     Most recent vital signs: Vitals:   02/20/23 1015  BP: (!) 140/90  Pulse: 69  Resp: 17  Temp: 97.8 F (36.6 C)  SpO2: 97%     General: Awake, no distress.  CV:  Good peripheral perfusion.  Resp:  Normal effort.  Abd:  No distention.  Other:  Overall very poor dental hygiene and repair.  Left upper premolar and molars are in poor repair and gums are edematous in this area.  No active drainage at this time.  Neck supple without cervical lymphadenopathy.   ED Results / Procedures / Treatments   Labs (all labs ordered are listed, but only abnormal results are displayed) Labs Reviewed - No data to display    PROCEDURES:  Critical Care performed:   Procedures   MEDICATIONS ORDERED IN ED: Medications - No data to display   IMPRESSION / MDM / ASSESSMENT AND PLAN / ED COURSE  I reviewed the triage vital  signs and the nursing notes.   Differential diagnosis includes, but is not limited to, dental pain, dental abscess, thrush, gingivitis.  62 year old male presents to the ED with complaint of dental pain and abscess.  Patient has in the last couple of months had a prescription for amoxicillin which he has taken.  He states he had an appointment with a dentist but this was rescheduled.  He now complains of having same symptoms.  He does continue to smoke cigarettes.  A prescription for clindamycin was sent to the pharmacy and he is strongly encouraged to keep his appointment for his rescheduled dental appointment.      Patient's presentation is most consistent with acute, uncomplicated illness.  FINAL CLINICAL IMPRESSION(S) / ED DIAGNOSES   Final diagnoses:  Dental abscess     Rx / DC Orders   ED Discharge Orders          Ordered    clindamycin (CLEOCIN) 300 MG capsule  3 times daily  02/20/23 1106             Note:  This document was prepared using Dragon voice recognition software and may include unintentional dictation errors.   Tommi Rumps, PA-C 02/20/23 1114    Jene Every, MD 02/20/23 [phone]

## 2023-02-20 NOTE — ED Notes (Signed)
See triage note  Presents with mouth pain  states pain started about 1 month ago  Presents with some blisters to mouth  No fever

## 2023-02-20 NOTE — ED Triage Notes (Signed)
Pt comes with c/o mouth pain that started month ago. Pt states he went to doc and got prescribed meds for it. Pt states it hasn't gotten better. Pt states blister/ sore in mouth on left side.

## 2023-02-20 NOTE — Discharge Instructions (Signed)
Keep your dental appointment that you rescheduled.  A prescription for antibiotics was sent to the pharmacy for you to begin taking.  Also you may need to stay with soft foods and eat on the right side of your mouth for the next several days.  Also rinsing your mouth out with warm salt water may help with your tooth.  Avoid drinking through a straw and also smoking cigarettes which is good to make your tooth pain worse.

## 2023-03-01 DIAGNOSIS — K0889 Other specified disorders of teeth and supporting structures: Secondary | ICD-10-CM | POA: Diagnosis not present

## 2023-03-01 DIAGNOSIS — K029 Dental caries, unspecified: Secondary | ICD-10-CM | POA: Diagnosis not present

## 2023-03-01 DIAGNOSIS — D72829 Elevated white blood cell count, unspecified: Secondary | ICD-10-CM | POA: Diagnosis not present

## 2023-03-01 DIAGNOSIS — F17210 Nicotine dependence, cigarettes, uncomplicated: Secondary | ICD-10-CM | POA: Diagnosis not present

## 2023-03-13 IMAGING — CT CT ORBITS W/ CM
3 of 4 series · 16 of 47 positions shown, 19 images · IV contrast (omnipaque)
Comparison: None.

CLINICAL DATA: Orbital cellulitis.

EXAM:
CT ORBITS WITH CONTRAST
TECHNIQUE: Multidetector CT images was performed according to the standard
protocol following intravenous contrast administration.
CONTRAST:  75mL OMNIPAQUE IOHEXOL 300 MG/ML  SOLN

[Series 3: orbits 2.0 h30s st · axial · 0.36mm/px · z∈[+336,+480]mm · 11 of 84 slices shown, 14 images]
[im 6/84  brain]
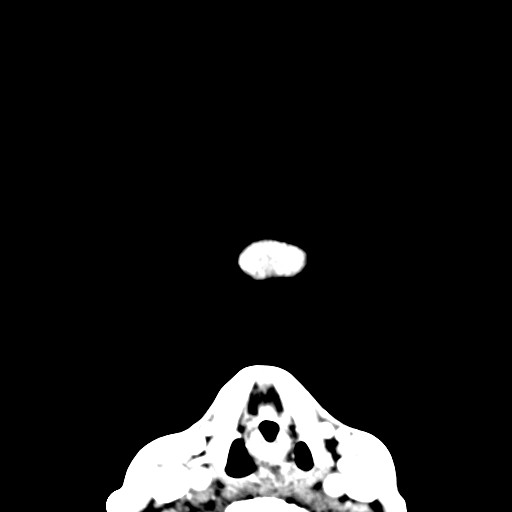
[im 6/84  bone]
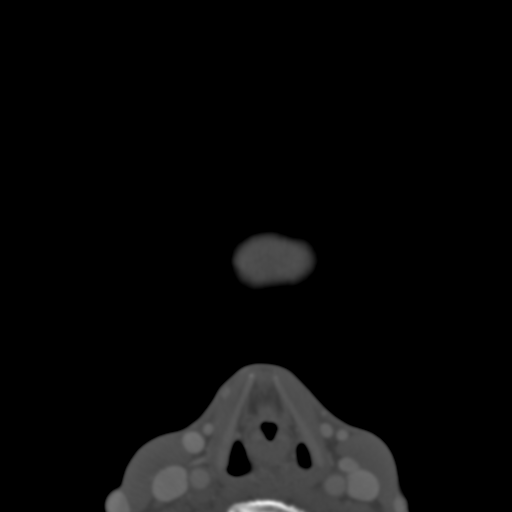
[im 12/84  bone]
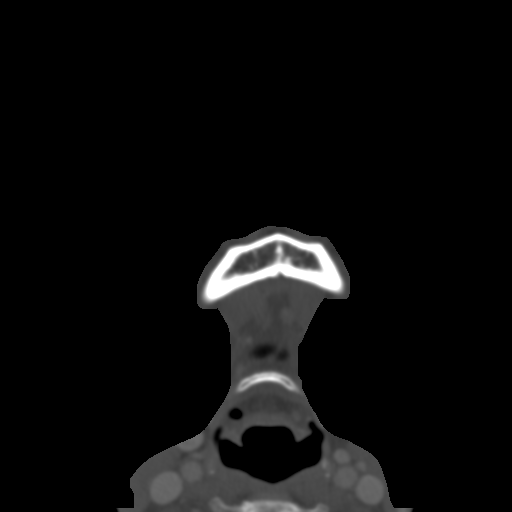
[im 21/84  bone]
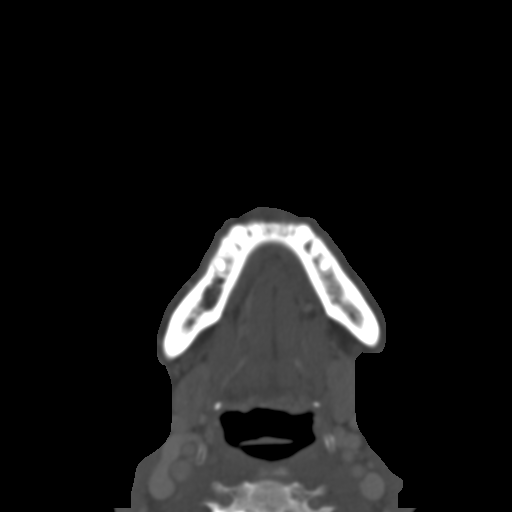
[im 26/84  bone]
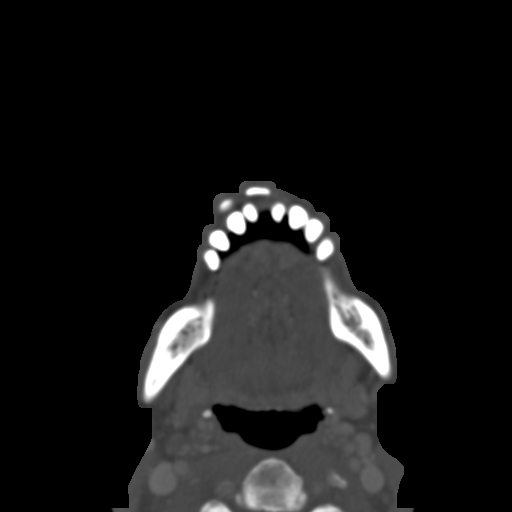
[im 35/84  brain]
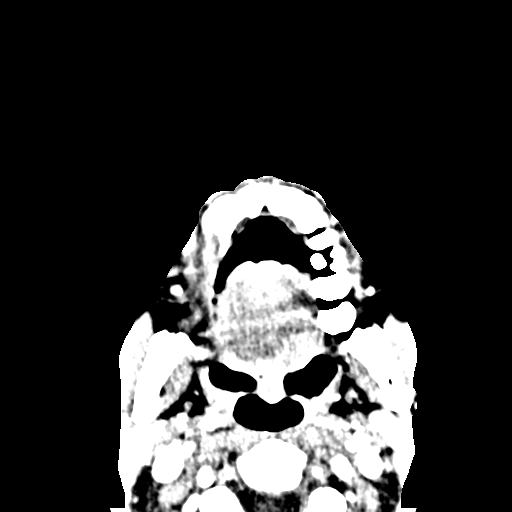
[im 35/84  bone]
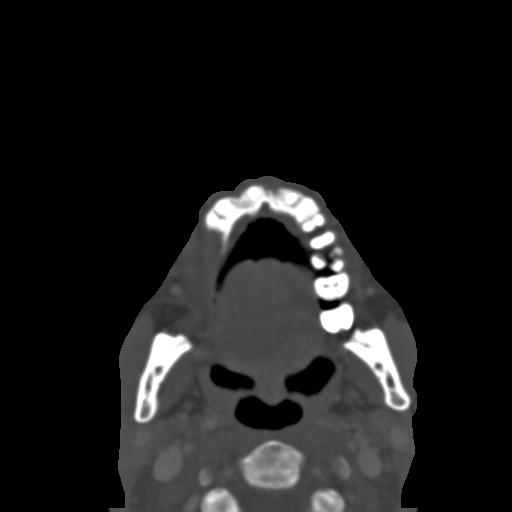
[im 43/84  bone]
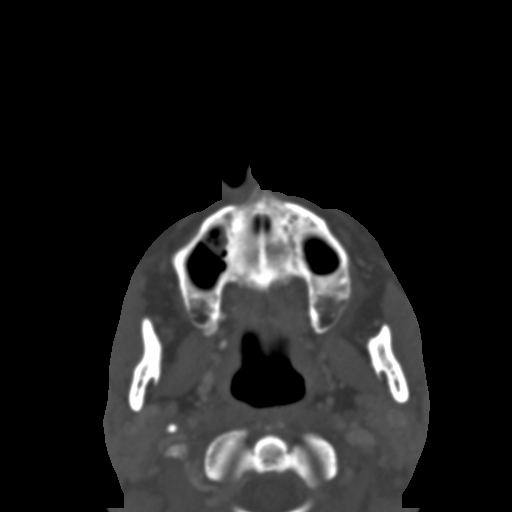
[im 49/84  bone]
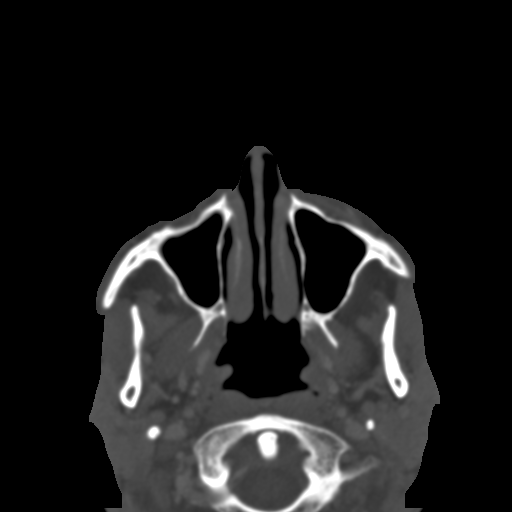
[im 58/84  bone]
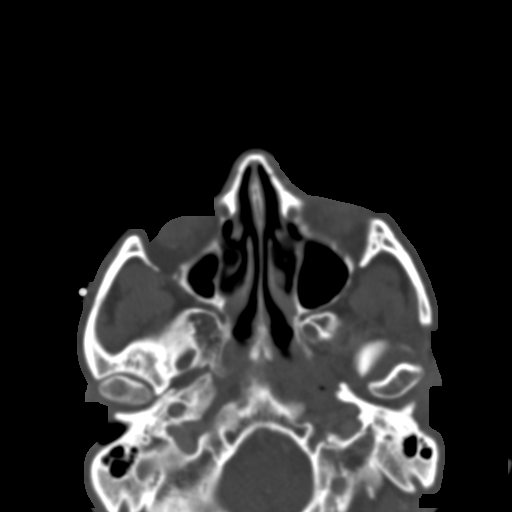
[im 63/84  brain]
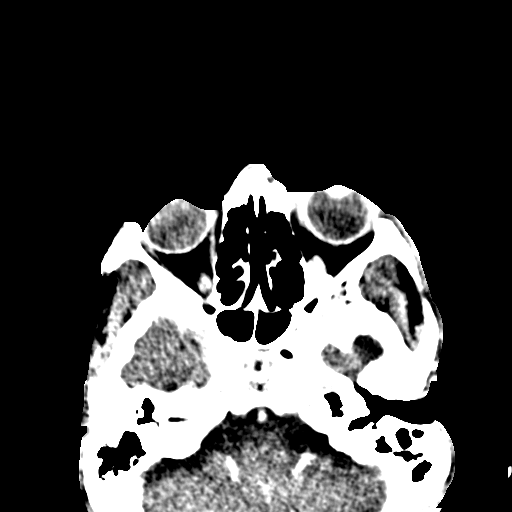
[im 63/84  bone]
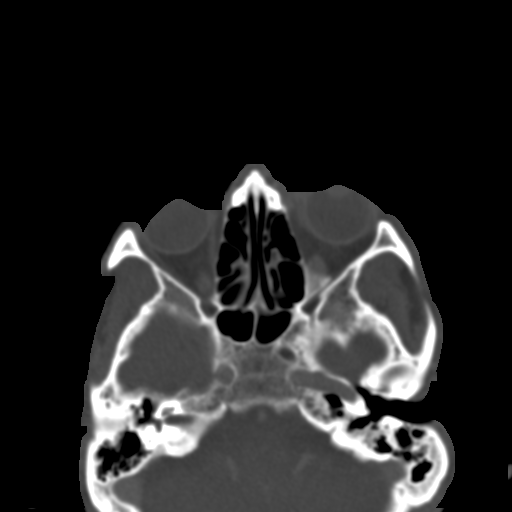
[im 72/84  bone]
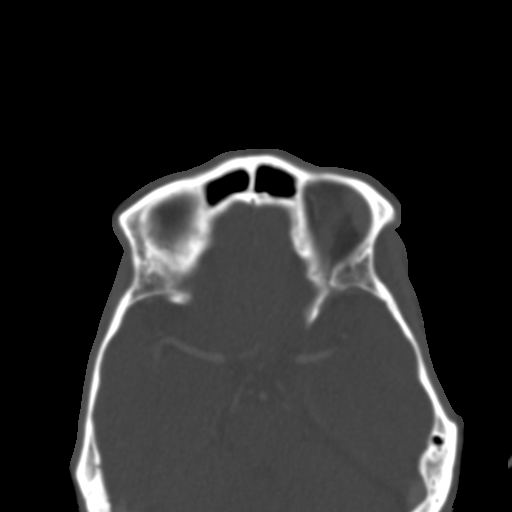
[im 78/84  bone]
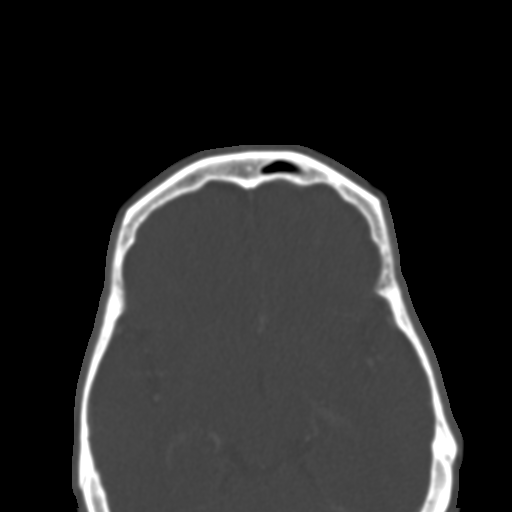

[Series 8: orbits 2.0 coronal · coronal · 0.37mm/px · 3 of 74 slices shown]
[im 25/74  bone]
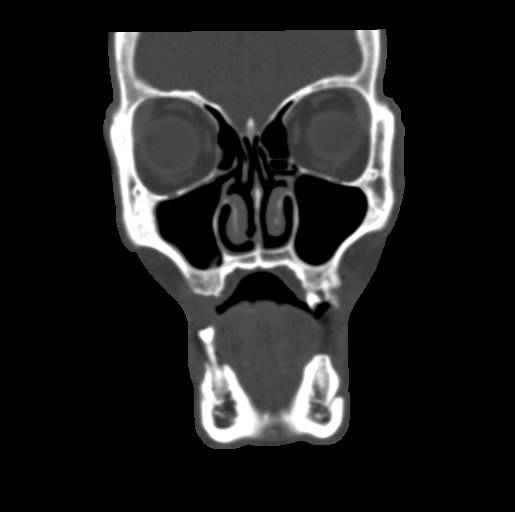
[im 33/74  bone]
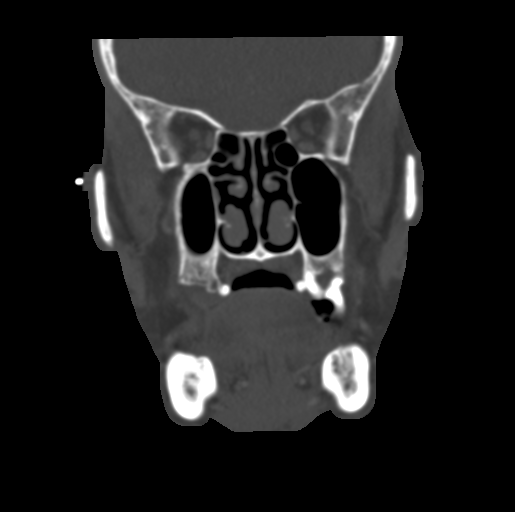
[im 41/74  bone]
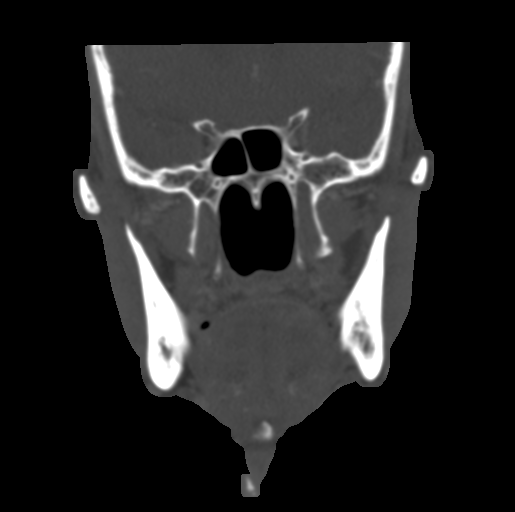

[Series 9: orbits 2.0 sagittal · sagittal · 0.38mm/px · 2 of 79 slices shown]
[im 27/79  bone]
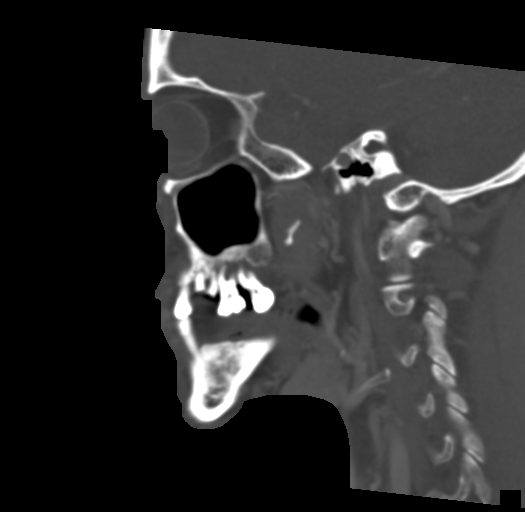
[im 53/79  bone]
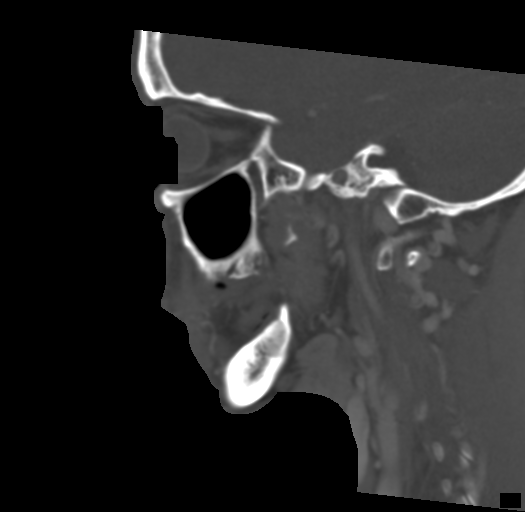

[16 of 47 positions shown; findings below may reference images not displayed]

FINDINGS: Orbits: Normal orbit bilaterally. No orbital edema or abscess. No
orbital mass. Globe normal bilaterally.

Periorbital soft tissue swelling around the left orbit described
below.

Visualized sinuses: Mild mucosal edema paranasal sinuses most
notably right maxillary sinus. No air-fluid level.

Skeletal: Poor dentition.  No acute bony abnormality.

Soft tissues: Moderate periorbital edema on the left, lateral to the
orbit and inferior to the orbit. Subcutaneous rim enhancing fluid
collection lateral to the left orbit measures 15 x 9 mm may
represent an abscess. Second small dermal lesion measuring
approximately 3 mm in diameter inferior to the larger fluid
collection, possible infection as well.

Normal pharynx and upper neck.

Limited intracranial: Negative
IMPRESSION: Periorbital swelling on the left compatible with cellulitis. There
is a rim enhancing fluid collection lateral to the left orbit
measuring 15 x 9 mm, suspicious for dermal abscess. Second smaller
dermal lesion inferior to the larger fluid collection may be a small
area of infection as well.

No evidence of orbital cellulitis.

## 2023-04-21 DIAGNOSIS — Z1389 Encounter for screening for other disorder: Secondary | ICD-10-CM | POA: Diagnosis not present

## 2023-04-21 DIAGNOSIS — F17210 Nicotine dependence, cigarettes, uncomplicated: Secondary | ICD-10-CM | POA: Diagnosis not present

## 2023-04-21 DIAGNOSIS — Z1211 Encounter for screening for malignant neoplasm of colon: Secondary | ICD-10-CM | POA: Diagnosis not present

## 2023-04-21 DIAGNOSIS — I10 Essential (primary) hypertension: Secondary | ICD-10-CM | POA: Diagnosis not present

## 2023-04-21 DIAGNOSIS — F17213 Nicotine dependence, cigarettes, with withdrawal: Secondary | ICD-10-CM | POA: Diagnosis not present

## 2023-04-21 DIAGNOSIS — Z7185 Encounter for immunization safety counseling: Secondary | ICD-10-CM | POA: Diagnosis not present

## 2023-04-21 DIAGNOSIS — Z720 Tobacco use: Secondary | ICD-10-CM | POA: Diagnosis not present

## 2023-04-21 DIAGNOSIS — R634 Abnormal weight loss: Secondary | ICD-10-CM | POA: Diagnosis not present

## 2023-04-21 DIAGNOSIS — K4090 Unilateral inguinal hernia, without obstruction or gangrene, not specified as recurrent: Secondary | ICD-10-CM | POA: Diagnosis not present

## 2023-04-21 DIAGNOSIS — Z0130 Encounter for examination of blood pressure without abnormal findings: Secondary | ICD-10-CM | POA: Diagnosis not present

## 2023-05-04 ENCOUNTER — Ambulatory Visit: Payer: Self-pay | Admitting: General Surgery

## 2023-05-04 NOTE — H&P (Signed)
History of Present Illness Johnathan Fowler is a 63 year old male who presents with a right inguinal hernia.   Initially evaluated in the emergency department on February 07, 2023, due to right groin pain, a CT scan of the abdomen and pelvis revealed a fat-containing right inguinal hernia without bowel involvement.  I personally evaluated the images  He has been aware of the hernia for about a year, noting progressive worsening over time. The hernia has become larger and more painful.  Pain aggravated especially when coughing, sneezing, or during bowel movements.  Alleviating factor is resting.  No pain radiation it reduces when lying down but protrudes when standing.  No previous abdominal surgeries, heart, or lung issues. He is not taking any blood thinners or aspirin.  He lives with his wife and is currently unemployed, although he is looking for jobs. He smokes cigarettes.      PAST MEDICAL HISTORY:  Patient denies any past medical history   PAST SURGICAL HISTORY:   Past Surgical History:  Procedure Laterality Date   HEMORRHOIDECTOMY EXTERNAL     1999 or 2000         MEDICATIONS:  No outpatient encounter medications on file as of 05/04/2023.   No facility-administered encounter medications on file as of 05/04/2023.  Patient does not take any medication regularly   ALLERGIES:   Patient has no known allergies.   SOCIAL HISTORY:  Social History   Socioeconomic History   Marital status: Single  Tobacco Use   Smoking status: Every Day    Current packs/day: 0.50    Types: Cigarettes   Smokeless tobacco: Never  Vaping Use   Vaping status: Never Used  Substance and Sexual Activity   Alcohol use: Yes    Alcohol/week: 10.0 standard drinks of alcohol    Types: 10 Cans of beer per week   Drug use: Yes    Comment: marijuana    FAMILY HISTORY:  Family History  Problem Relation Name Age of Onset   No Known Problems Mother     No Known Problems Father     No Known Problems  Sister     No Known Problems Brother     No Known Problems Brother       GENERAL REVIEW OF SYSTEMS:   General ROS: negative for - chills, fatigue, fever, weight gain or weight loss Allergy and Immunology ROS: negative for - hives  Hematological and Lymphatic ROS: negative for - bleeding problems or bruising, negative for palpable nodes Endocrine ROS: negative for - heat or cold intolerance, hair changes Respiratory ROS: negative for - cough, shortness of breath or wheezing Cardiovascular ROS: no chest pain or palpitations GI ROS: negative for nausea, vomiting, abdominal pain, diarrhea, constipation Musculoskeletal ROS: negative for - joint swelling or muscle pain Neurological ROS: negative for - confusion, syncope Dermatological ROS: negative for pruritus and rash  PHYSICAL EXAM:  Vitals:   05/04/23 1002  BP: (!) 140/82  Pulse: 96  .  Ht:180.3 cm (5\' 11" ) Wt:49.9 kg (110 lb) ZDG:LOVF surface area is 1.58 meters squared. Body mass index is 15.34 kg/m.Marland Kitchen   GENERAL: Alert, active, oriented x3  HEENT: Pupils equal reactive to light. Extraocular movements are intact. Sclera clear. Palpebral conjunctiva normal red color.Pharynx clear.  NECK: Supple with no palpable mass and no adenopathy.  LUNGS: Sound clear with no rales rhonchi or wheezes.  HEART: Regular rhythm S1 and S2 without murmur.  ABDOMEN: Soft and depressible, nontender with no palpable mass, no hepatomegaly.  Right reducible inguinal hernia with weakness on the left groin.  EXTREMITIES: Well-developed well-nourished symmetrical with no dependent edema.  NEUROLOGICAL: Awake alert oriented, facial expression symmetrical, moving all extremities.   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 It is  reducible when lying down but protrudes when standing or coughing. A CT scan confirmed a fat-containing right inguinal hernia without bowel involvement. Surgical repair is necessary. The procedure involves anesthesia, small incisions, and mesh placement. Risks include infection, recurrence, postoperative swelling, and bruising. Smoking cessation is crucial to reduce infection and recurrence risk. The surgery is a same-day procedure with a recovery period of one to two weeks. There is a plan to evaluate and repair a potential left-side hernia during surgery if found. Schedule laparoscopic hernia repair surgery. Avoid heavy lifting until surgery. Recommend smoking cessation. Postoperative follow-up in two weeks.  General Health Maintenance Smoking cessation is advised for overall health and to reduce surgical risks. Current unemployment may impact adherence to postoperative care. Immediate smoking cessation is advised.  Follow-up Postoperative follow-up appointment in two weeks.   Non-recurrent unilateral inguinal hernia without obstruction or gangrene [K40.90]          Patient verbalized understanding, all questions were answered, and were agreeable with the plan outlined above.

## 2023-05-04 NOTE — H&P (View-Only) (Signed)
 History of Present Illness Johnathan Fowler is a 63 year old male who presents with a right inguinal hernia.   Initially evaluated in the emergency department on February 07, 2023, due to right groin pain, a CT scan of the abdomen and pelvis revealed a fat-containing right inguinal hernia without bowel involvement.  I personally evaluated the images  He has been aware of the hernia for about a year, noting progressive worsening over time. The hernia has become larger and more painful.  Pain aggravated especially when coughing, sneezing, or during bowel movements.  Alleviating factor is resting.  No pain radiation it reduces when lying down but protrudes when standing.  No previous abdominal surgeries, heart, or lung issues. He is not taking any blood thinners or aspirin.  He lives with his wife and is currently unemployed, although he is looking for jobs. He smokes cigarettes.      PAST MEDICAL HISTORY:  Patient denies any past medical history   PAST SURGICAL HISTORY:   Past Surgical History:  Procedure Laterality Date   HEMORRHOIDECTOMY EXTERNAL     1999 or 2000         MEDICATIONS:  No outpatient encounter medications on file as of 05/04/2023.   No facility-administered encounter medications on file as of 05/04/2023.  Patient does not take any medication regularly   ALLERGIES:   Patient has no known allergies.   SOCIAL HISTORY:  Social History   Socioeconomic History   Marital status: Single  Tobacco Use   Smoking status: Every Day    Current packs/day: 0.50    Types: Cigarettes   Smokeless tobacco: Never  Vaping Use   Vaping status: Never Used  Substance and Sexual Activity   Alcohol use: Yes    Alcohol/week: 10.0 standard drinks of alcohol    Types: 10 Cans of beer per week   Drug use: Yes    Comment: marijuana    FAMILY HISTORY:  Family History  Problem Relation Name Age of Onset   No Known Problems Mother     No Known Problems Father     No Known Problems  Sister     No Known Problems Brother     No Known Problems Brother       GENERAL REVIEW OF SYSTEMS:   General ROS: negative for - chills, fatigue, fever, weight gain or weight loss Allergy and Immunology ROS: negative for - hives  Hematological and Lymphatic ROS: negative for - bleeding problems or bruising, negative for palpable nodes Endocrine ROS: negative for - heat or cold intolerance, hair changes Respiratory ROS: negative for - cough, shortness of breath or wheezing Cardiovascular ROS: no chest pain or palpitations GI ROS: negative for nausea, vomiting, abdominal pain, diarrhea, constipation Musculoskeletal ROS: negative for - joint swelling or muscle pain Neurological ROS: negative for - confusion, syncope Dermatological ROS: negative for pruritus and rash  PHYSICAL EXAM:  Vitals:   05/04/23 1002  BP: (!) 140/82  Pulse: 96  .  Ht:180.3 cm (5\' 11" ) Wt:49.9 kg (110 lb) ZDG:LOVF surface area is 1.58 meters squared. Body mass index is 15.34 kg/m.Marland Kitchen   GENERAL: Alert, active, oriented x3  HEENT: Pupils equal reactive to light. Extraocular movements are intact. Sclera clear. Palpebral conjunctiva normal red color.Pharynx clear.  NECK: Supple with no palpable mass and no adenopathy.  LUNGS: Sound clear with no rales rhonchi or wheezes.  HEART: Regular rhythm S1 and S2 without murmur.  ABDOMEN: Soft and depressible, nontender with no palpable mass, no hepatomegaly.  Right reducible inguinal hernia with weakness on the left groin.  EXTREMITIES: Well-developed well-nourished symmetrical with no dependent edema.  NEUROLOGICAL: Awake alert oriented, facial expression symmetrical, moving all extremities.   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 It is  reducible when lying down but protrudes when standing or coughing. A CT scan confirmed a fat-containing right inguinal hernia without bowel involvement. Surgical repair is necessary. The procedure involves anesthesia, small incisions, and mesh placement. Risks include infection, recurrence, postoperative swelling, and bruising. Smoking cessation is crucial to reduce infection and recurrence risk. The surgery is a same-day procedure with a recovery period of one to two weeks. There is a plan to evaluate and repair a potential left-side hernia during surgery if found. Schedule laparoscopic hernia repair surgery. Avoid heavy lifting until surgery. Recommend smoking cessation. Postoperative follow-up in two weeks.  General Health Maintenance Smoking cessation is advised for overall health and to reduce surgical risks. Current unemployment may impact adherence to postoperative care. Immediate smoking cessation is advised.  Follow-up Postoperative follow-up appointment in two weeks.   Non-recurrent unilateral inguinal hernia without obstruction or gangrene [K40.90]          Patient verbalized understanding, all questions were answered, and were agreeable with the plan outlined above.

## 2023-05-09 ENCOUNTER — Other Ambulatory Visit: Payer: Self-pay

## 2023-05-09 ENCOUNTER — Encounter
Admission: RE | Admit: 2023-05-09 | Discharge: 2023-05-09 | Disposition: A | Discharge status: Home / Self Care | Payer: Medicaid Other | Source: Ambulatory Visit | Attending: General Surgery | Admitting: General Surgery

## 2023-05-09 NOTE — Patient Instructions (Addendum)
 Your procedure is scheduled on: Monday 05/15/23 To find out your arrival time, please call [phone] between 1PM - 3PM on:   Friday 05/12/23 Report to the Registration Desk on the 1st floor of the [facility]. FREE Valet parking is available.  If your arrival time is 6:00 am, do not arrive before that time as the [facility] entrance doors do not open until 6:00 am.  REMEMBER: Instructions that are not followed completely may result in serious medical risk, up to and including death; or upon the discretion of your surgeon and anesthesiologist your surgery may need to be rescheduled.  Do not eat food or drink any liquids after midnight the night before surgery.  No gum chewing or hard candies.  One week prior to surgery: Stop Anti-inflammatories (NSAIDS) such as Advil, Aleve, Ibuprofen, Motrin, Naproxen, Naprosyn and Aspirin based products such as Excedrin, Goody's Powder, BC Powder. You may however, continue to take Tylenol if needed for pain up until the day of surgery.  Stop ANY OVER THE COUNTER supplements until after surgery.  Continue taking all prescribed medications.   TAKE ONLY THESE MEDICATIONS THE MORNING OF SURGERY WITH A SIP OF WATER:  none  No Alcohol for 24 hours before or after surgery.  No Smoking including e-cigarettes for 24 hours before surgery.  No chewable tobacco products for at least 6 hours before surgery.  No nicotine patches on the day of surgery.  Do not use any "recreational" drugs for at least a week (preferably 2 weeks) before your surgery.  Please be advised that the combination of cocaine and anesthesia may have negative outcomes, up to and including death. If you test positive for cocaine, your surgery will be cancelled.  On the morning of surgery brush your teeth with toothpaste and water, you may rinse your mouth with mouthwash if you wish. Do not swallow any toothpaste or mouthwash.  Use CHG Soap or wipes as directed on instruction  sheet.  Do not wear lotions, powders, or perfumes.   Do not shave body hair from the neck down 48 hours before surgery.  Wear clean comfortable clothing (specific to your surgery type) to the hospital.  Do not wear jewelry, make-up, hairpins, clips or nail polish.  For welded (permanent) jewelry: bracelets, anklets, waist bands, etc.  Please have this removed prior to surgery.  If it is not removed, there is a chance that hospital personnel will need to cut it off on the day of surgery. Contact lenses, hearing aids and dentures may not be worn into surgery.  Do not bring valuables to the hospital. [facility] is not responsible for any missing/lost belongings or valuables.   Notify your doctor if there is any change in your medical condition (cold, fever, infection).  If you are being discharged the day of surgery, you will not be allowed to drive home. You will need a responsible individual to drive you home and stay with you for 24 hours after surgery.   If you are taking public transportation, you will need to have a responsible individual with you.  If you are being admitted to the hospital overnight, leave your suitcase in the car. After surgery it may be brought to your room.  In case of increased patient census, it may be necessary for you, the patient, to continue your postoperative care in the Same Day Surgery department.  After surgery, you can help prevent lung complications by doing breathing exercises.  Take deep breaths and cough every  1-2 hours. Your doctor may order a device called an Incentive Spirometer to help you take deep breaths. When coughing or sneezing, hold a pillow firmly against your incision with both hands. This is called "splinting." Doing this helps protect your incision. It also decreases belly discomfort.  Surgery Visitation Policy:  Patients undergoing a surgery or procedure may have two family members or support persons with them as long as the  person is not COVID-19 positive or experiencing its symptoms.   Inpatient Visitation:    Visiting hours are 7 a.m. to 8 p.m. Up to four visitors are allowed at one time in a patient room. The visitors may rotate out with other people during the day. One designated support person (adult) may remain overnight.  Due to an increase in RSV and influenza rates and associated hospitalizations, children ages 11 and under will not be able to visit patients in [facility]. Masks continue to be strongly recommended.  Please call the Pre-admissions Testing Dept. at [phone] if you have any questions about these instructions.     Preparing for Surgery with CHLORHEXIDINE GLUCONATE (CHG) Soap  Chlorhexidine Gluconate (CHG) Soap  o An antiseptic cleaner that kills germs and bonds with the skin to continue killing germs even after washing  o Used for showering the night before surgery and morning of surgery  Before surgery, you can play an important role by reducing the number of germs on your skin.  CHG (Chlorhexidine gluconate) soap is an antiseptic cleanser which kills germs and bonds with the skin to continue killing germs even after washing.  Please do not use if you have an allergy to CHG or antibacterial soaps. If your skin becomes reddened/irritated stop using the CHG.  1. Shower the NIGHT BEFORE SURGERY and the MORNING OF SURGERY with CHG soap.  2. If you choose to wash your hair, wash your hair first as usual with your normal shampoo.  3. After shampooing, rinse your hair and body thoroughly to remove the shampoo.  4. Use CHG as you would any other liquid soap. You can apply CHG directly to the skin and wash gently with a scrungie or a clean washcloth.  5. Apply the CHG soap to your body only from the neck down. Do not use on open wounds or open sores. Avoid contact with your eyes, ears, mouth, and genitals (private parts). Wash face and genitals (private parts) with your  normal soap.  6. Wash thoroughly, paying special attention to the area where your surgery will be performed.  7. Thoroughly rinse your body with warm water.  8. Do not shower/wash with your normal soap after using and rinsing off the CHG soap.  9. Pat yourself dry with a clean towel.  10. Wear clean pajamas to bed the night before surgery.  12. Place clean sheets on your bed the night of your first shower and do not sleep with pets.  13. Shower again with the CHG soap on the day of surgery prior to arriving at the hospital.  14. Do not apply any deodorants/lotions/powders.  15. Please wear clean clothes to the hospital.

## 2023-05-15 ENCOUNTER — Ambulatory Visit
Admission: RE | Admit: 2023-05-15 | Discharge: 2023-05-15 | Disposition: A | Discharge status: Home / Self Care | Payer: Medicaid Other | Attending: General Surgery | Admitting: General Surgery

## 2023-05-15 ENCOUNTER — Encounter: Payer: Self-pay | Admitting: General Surgery

## 2023-05-15 ENCOUNTER — Other Ambulatory Visit: Payer: Self-pay

## 2023-05-15 ENCOUNTER — Ambulatory Visit: Payer: Self-pay

## 2023-05-15 ENCOUNTER — Encounter
Admission: RE | Disposition: A | Discharge status: Home / Self Care | Payer: Self-pay | Source: Home / Self Care | Attending: General Surgery

## 2023-05-15 DIAGNOSIS — F17210 Nicotine dependence, cigarettes, uncomplicated: Secondary | ICD-10-CM | POA: Diagnosis not present

## 2023-05-15 DIAGNOSIS — K4090 Unilateral inguinal hernia, without obstruction or gangrene, not specified as recurrent: Secondary | ICD-10-CM | POA: Insufficient documentation

## 2023-05-15 DIAGNOSIS — D176 Benign lipomatous neoplasm of spermatic cord: Secondary | ICD-10-CM | POA: Diagnosis not present

## 2023-05-15 HISTORY — PX: INSERTION OF MESH: SHX5868

## 2023-05-15 SURGERY — REPAIR, HERNIA, INGUINAL, BILATERAL, ROBOT-ASSISTED
Anesthesia: General | Site: Inguinal | Laterality: Bilateral

## 2023-05-15 MED ORDER — SUGAMMADEX SODIUM 200 MG/2ML IV SOLN
INTRAVENOUS | Status: AC
  Filled 2023-05-15: qty 2

## 2023-05-15 MED ORDER — OXYCODONE HCL 5 MG PO TABS
5.0000 mg | ORAL_TABLET | Freq: Once | ORAL | Status: AC | PRN
  Administered 2023-05-15: 5 mg via ORAL

## 2023-05-15 MED ORDER — DEXAMETHASONE SODIUM PHOSPHATE 10 MG/ML IJ SOLN
INTRAMUSCULAR | Status: DC | PRN
  Administered 2023-05-15: 10 mg via INTRAVENOUS

## 2023-05-15 MED ORDER — ONDANSETRON HCL 4 MG/2ML IJ SOLN
INTRAMUSCULAR | Status: DC | PRN
  Administered 2023-05-15: 4 mg via INTRAVENOUS

## 2023-05-15 MED ORDER — MIDAZOLAM HCL 2 MG/2ML IJ SOLN
INTRAMUSCULAR | Status: AC
  Filled 2023-05-15: qty 2

## 2023-05-15 MED ORDER — HYDROCODONE-ACETAMINOPHEN 5-325 MG PO TABS: 1.0000 | ORAL_TABLET | Freq: Four times a day (QID) | ORAL | 0 refills | Status: AC | PRN

## 2023-05-15 MED ORDER — SUGAMMADEX SODIUM 200 MG/2ML IV SOLN
INTRAVENOUS | Status: DC | PRN
Start: 2023-05-15 — End: 2023-05-15
  Administered 2023-05-15: 100 mg via INTRAVENOUS
  Administered 2023-05-15 (×2): 50 mg via INTRAVENOUS

## 2023-05-15 MED ORDER — LIDOCAINE HCL (CARDIAC) PF 100 MG/5ML IV SOSY
PREFILLED_SYRINGE | INTRAVENOUS | Status: DC | PRN
  Administered 2023-05-15: 60 mg via INTRAVENOUS

## 2023-05-15 MED ORDER — KETAMINE HCL 50 MG/5ML IJ SOSY
PREFILLED_SYRINGE | INTRAMUSCULAR | Status: DC | PRN
  Administered 2023-05-15: 25 mg via INTRAVENOUS

## 2023-05-15 MED ORDER — ROCURONIUM BROMIDE 100 MG/10ML IV SOLN
INTRAVENOUS | Status: DC | PRN
  Administered 2023-05-15: 10 mg via INTRAVENOUS
  Administered 2023-05-15: 50 mg via INTRAVENOUS

## 2023-05-15 MED ORDER — KETAMINE HCL 50 MG/5ML IJ SOSY
PREFILLED_SYRINGE | INTRAMUSCULAR | Status: AC
  Filled 2023-05-15: qty 5

## 2023-05-15 MED ORDER — FENTANYL CITRATE (PF) 100 MCG/2ML IJ SOLN
25.0000 ug | INTRAMUSCULAR | Status: DC | PRN
  Administered 2023-05-15: 25 ug via INTRAVENOUS
  Administered 2023-05-15: 50 ug via INTRAVENOUS
  Administered 2023-05-15: 25 ug via INTRAVENOUS

## 2023-05-15 MED ORDER — FENTANYL CITRATE (PF) 100 MCG/2ML IJ SOLN
INTRAMUSCULAR | Status: AC
  Filled 2023-05-15: qty 2

## 2023-05-15 MED ORDER — ACETAMINOPHEN 10 MG/ML IV SOLN
INTRAVENOUS | Status: DC | PRN
  Administered 2023-05-15: 1000 mg via INTRAVENOUS

## 2023-05-15 MED ORDER — OXYCODONE HCL 5 MG PO TABS
ORAL_TABLET | ORAL | Status: AC
  Filled 2023-05-15: qty 1

## 2023-05-15 MED ORDER — MIDAZOLAM HCL 2 MG/2ML IJ SOLN
INTRAMUSCULAR | Status: DC | PRN
  Administered 2023-05-15: 2 mg via INTRAVENOUS

## 2023-05-15 MED ORDER — PROPOFOL 10 MG/ML IV BOLUS
INTRAVENOUS | Status: AC
  Filled 2023-05-15: qty 20

## 2023-05-15 MED ORDER — PHENYLEPHRINE 80 MCG/ML (10ML) SYRINGE FOR IV PUSH (FOR BLOOD PRESSURE SUPPORT)
PREFILLED_SYRINGE | INTRAVENOUS | Status: AC
  Filled 2023-05-15: qty 10

## 2023-05-15 MED ORDER — PHENYLEPHRINE 80 MCG/ML (10ML) SYRINGE FOR IV PUSH (FOR BLOOD PRESSURE SUPPORT)
PREFILLED_SYRINGE | INTRAVENOUS | Status: DC | PRN
  Administered 2023-05-15: 160 ug via INTRAVENOUS
  Administered 2023-05-15 (×2): 40 ug via INTRAVENOUS

## 2023-05-15 MED ORDER — BUPIVACAINE-EPINEPHRINE 0.25% -1:200000 IJ SOLN
INTRAMUSCULAR | Status: DC | PRN
  Administered 2023-05-15: 30 mL

## 2023-05-15 MED ORDER — PROPOFOL 10 MG/ML IV BOLUS
INTRAVENOUS | Status: DC | PRN
  Administered 2023-05-15: 150 mg via INTRAVENOUS

## 2023-05-15 MED ORDER — LACTATED RINGERS IV SOLN: INTRAVENOUS | Status: DC

## 2023-05-15 MED ORDER — CEFAZOLIN SODIUM-DEXTROSE 2-4 GM/100ML-% IV SOLN
INTRAVENOUS | Status: AC
  Filled 2023-05-15: qty 100

## 2023-05-15 MED ORDER — OXYCODONE HCL 5 MG/5ML PO SOLN: 5.0000 mg | Freq: Once | ORAL | Status: AC | PRN

## 2023-05-15 MED ORDER — ORAL CARE MOUTH RINSE: 15.0000 mL | Freq: Once | OROMUCOSAL | Status: AC

## 2023-05-15 MED ORDER — CHLORHEXIDINE GLUCONATE 0.12 % MT SOLN
15.0000 mL | Freq: Once | OROMUCOSAL | Status: AC
  Administered 2023-05-15: 15 mL via OROMUCOSAL

## 2023-05-15 MED ORDER — CHLORHEXIDINE GLUCONATE 0.12 % MT SOLN
OROMUCOSAL | Status: AC
  Filled 2023-05-15: qty 15

## 2023-05-15 MED ORDER — FENTANYL CITRATE (PF) 100 MCG/2ML IJ SOLN
INTRAMUSCULAR | Status: AC
Start: 2023-05-15 — End: ?
  Filled 2023-05-15: qty 2

## 2023-05-15 MED ORDER — CEFAZOLIN SODIUM-DEXTROSE 2-4 GM/100ML-% IV SOLN
2.0000 g | INTRAVENOUS | Status: AC
  Administered 2023-05-15: 2 g via INTRAVENOUS

## 2023-05-15 MED ORDER — ACETAMINOPHEN 10 MG/ML IV SOLN
INTRAVENOUS | Status: AC
  Filled 2023-05-15: qty 100

## 2023-05-15 MED ORDER — FENTANYL CITRATE (PF) 100 MCG/2ML IJ SOLN
INTRAMUSCULAR | Status: DC | PRN
  Administered 2023-05-15 (×2): 50 ug via INTRAVENOUS

## 2023-05-15 MED ORDER — BUPIVACAINE-EPINEPHRINE (PF) 0.25% -1:200000 IJ SOLN
INTRAMUSCULAR | Status: AC
  Filled 2023-05-15: qty 30

## 2023-05-15 SURGICAL SUPPLY — 43 items
BAG PRESSURE INF REUSE 1000 (BAG) IMPLANT
COVER TIP SHEARS 8 DVNC (MISCELLANEOUS) ×2 IMPLANT
COVER WAND RF STERILE (DRAPES) ×2 IMPLANT
DERMABOND ADVANCED .7 DNX12 (GAUZE/BANDAGES/DRESSINGS) ×2 IMPLANT
DRAPE ARM DVNC X/XI (DISPOSABLE) ×6 IMPLANT
DRAPE COLUMN DVNC XI (DISPOSABLE) ×2 IMPLANT
ELECT REM PT RETURN 9FT ADLT (ELECTROSURGICAL) ×2 IMPLANT
ELECTRODE REM PT RTRN 9FT ADLT (ELECTROSURGICAL) ×2 IMPLANT
FORCEPS BPLR FENES DVNC XI (FORCEP) ×2 IMPLANT
GLOVE BIO SURGEON STRL SZ 6.5 (GLOVE) ×4 IMPLANT
GLOVE BIOGEL PI IND STRL 6.5 (GLOVE) ×4 IMPLANT
GOWN STRL REUS W/ TWL LRG LVL3 (GOWN DISPOSABLE) ×6 IMPLANT
IRRIGATOR SUCT 8 DISP DVNC XI (IRRIGATION / IRRIGATOR) IMPLANT
IV CATH ANGIO 12GX3 LT BLUE (NEEDLE) IMPLANT
IV NS 1000ML BAXH (IV SOLUTION) IMPLANT
KIT PINK PAD W/HEAD ARE REST (MISCELLANEOUS) ×2 IMPLANT
KIT PINK PAD W/HEAD ARM REST (MISCELLANEOUS) ×2 IMPLANT
LABEL OR SOLS (LABEL) IMPLANT
MANIFOLD NEPTUNE II (INSTRUMENTS) ×2 IMPLANT
MESH 3DMAX MID 5X7 RT XLRG (Mesh General) IMPLANT
NDL DRIVE SUT CUT DVNC (INSTRUMENTS) ×2 IMPLANT
NDL HYPO 22X1.5 SAFETY MO (MISCELLANEOUS) ×2 IMPLANT
NDL INSUFFLATION 14GA 120MM (NEEDLE) ×2 IMPLANT
NEEDLE DRIVE SUT CUT DVNC (INSTRUMENTS) ×2 IMPLANT
NEEDLE HYPO 22X1.5 SAFETY MO (MISCELLANEOUS) ×2 IMPLANT
NEEDLE INSUFFLATION 14GA 120MM (NEEDLE) ×2 IMPLANT
OBTURATOR OPTICAL STND 8 DVNC (TROCAR) ×2 IMPLANT
OBTURATOR OPTICALSTD 8 DVNC (TROCAR) ×2 IMPLANT
PACK LAP CHOLECYSTECTOMY (MISCELLANEOUS) ×2 IMPLANT
SCISSORS MNPLR CVD DVNC XI (INSTRUMENTS) ×2 IMPLANT
SEAL UNIV 5-12 XI (MISCELLANEOUS) ×6 IMPLANT
SET TUBE SMOKE EVAC HIGH FLOW (TUBING) ×2 IMPLANT
SOL ELECTROSURG ANTI STICK (MISCELLANEOUS) ×2 IMPLANT
SOLUTION ELECTROSURG ANTI STCK (MISCELLANEOUS) ×2 IMPLANT
SUT MNCRL 4-0 27 PS-2 XMFL (SUTURE) ×2 IMPLANT
SUT STRATA 2-0 23CM CT-2 (SUTURE) ×2 IMPLANT
SUT VIC AB 2-0 SH 27XBRD (SUTURE) ×2 IMPLANT
SUT VIC AB 2-0 UR6 27 (SUTURE) IMPLANT
SUTURE MNCRL 4-0 27XMF (SUTURE) ×2 IMPLANT
TAPE TRANSPORE STRL 2 31045 (GAUZE/BANDAGES/DRESSINGS) IMPLANT
TRAP FLUID SMOKE EVACUATOR (MISCELLANEOUS) ×2 IMPLANT
TRAY FOLEY MTR SLVR 16FR STAT (SET/KITS/TRAYS/PACK) ×2 IMPLANT
WATER STERILE IRR 500ML POUR (IV SOLUTION) ×2 IMPLANT

## 2023-05-15 NOTE — Anesthesia Procedure Notes (Signed)
 Procedure Name: Intubation Date/Time: 05/15/2023 10:31 AM  Performed by: Susy Manor, RNPre-anesthesia Checklist: Patient identified, Patient being monitored, Timeout performed, Emergency Drugs available and Suction available Patient Re-evaluated:Patient Re-evaluated prior to induction Oxygen Delivery Method: Circle system utilized Preoxygenation: Pre-oxygenation with 100% oxygen Induction Type: IV induction Ventilation: Mask ventilation without difficulty Laryngoscope Size: Mac, 3 and 4 Grade View: Grade I Tube type: Oral Tube size: 7.5 mm Number of attempts: 1 Airway Equipment and Method: Stylet Placement Confirmation: ETT inserted through vocal cords under direct vision, positive ETCO2 and breath sounds checked- equal and bilateral Secured at: 23 cm Tube secured with: Tape Dental Injury: Teeth and Oropharynx as per pre-operative assessment

## 2023-05-15 NOTE — Discharge Instructions (Addendum)

## 2023-05-15 NOTE — Op Note (Signed)
 Preoperative diagnosis: Right inguinal hernia.   Postoperative diagnosis: Right inguinal hernia.  Procedure: Robotic assisted Laparoscopic Transabdominal preperitoneal laparoscopic (TAPP) repair of right inguinal hernia.  Anesthesia: GETA  Surgeon: Dr. Hazle Quant  Wound Classification: Clean  Indications:  Patient is a 63 y.o. male developed a symptomatic right inguinal hernia. Repair was indicated.  Findings: 1. Right direct Inguinal hernia identified 2. Vas deferens and cord structures identified and preserved 3. Bard Extra Large 3D Max MID Anatomical mesh used for repair 4. Adequate hemostasis.   Description of procedure:  The patient was taken to the operating room and the correct side of surgery was verified. The patient was placed supine with right arm tucked at the side. After obtaining adequate anesthesia, the patient's abdomen was prepped and draped in standard sterile fashion. A time-out was completed verifying correct patient, procedure, site, positioning, and implant(s) and/or special equipment prior to beginning this procedure.  An incision was made in a natural skin line above the umbilicus. The fascia was elevated and the Veress needle inserted. Proper position was confirmed by aspiration and saline meniscus test.  The abdomen was insufflated with carbon dioxide to a pressure of 15 mmHg. The patient tolerated insufflation well.  Abdominal cavity was entered using Optiview technique with a millimeter trocar.  No injury was identified.  Another 2 mm trocars were placed lateral to each rectus muscle.  Scissors and bipolar forceps were inserted under direct visualization. At the robotic console: Transverse peritoneal incision is made about 8 cm superior to the inguinal defect. Medial to the epigastric vessels, the parietal compartment is dissected to visualize the rectus muscle. This is carried down to the symphysis pubis and the retropubic space is dissected to expose at least 2  cm contralateral to the midline. Cooper's ligament is exposed and cleared at least 2 cm below the ligament to ensure adequate space for the inferior border of the mesh. Hesselbach's triangle is cleared assessing for a direct hernia. The hernia is reduced dissecting the contents away from the border of the transversalis (white) fascia. The pseudosac was imbricated onto Cooper's ligament in lieau of closure of the direct defect to prevent injury to the anterior nerves and cord structures. Lateral to the epigastric vessels, the dissection is carried out in visceral compartment continuing in the true preperitoneal plane. This dissection was continued until the cord structures are "parietalized" completely, allowing for visualization of the reflected peritoneum that is continuous with the line originating 2 cm below Coopers medially and across the psoas muscle in the lateral compartment.  The internal ring was interrogated for a cord lipoma. The cord lipoma was reduced to the retroperitoneum and seated dorsal to the preperitoneal mesh. Having achieved a complete dissection with a critical view of the entire myopectineal orifice, an XL mesh was then positioned centered at the iliopubic tract with the medial side crossing the midline and the inferior edge positioned 2 cm below Coopers ligament. The lateral aspect of the mesh extended 3-5 cm beyond the lateral edge of the psoas. The mesh is fixated using an interrupted suture placed to the ipsilateral Coopers ligament. A second suture was done at the medial superior aspect of the mesh fixating this to the rectus complex.  The peritoneal flap is closed with running barbed suture. Additional holes in the peritoneum closed with suture. Preperitoneal space gas aspirated to visualize the peritoneum apposed directly against the mesh and ensure no folding, lifting, or buckling of the mesh. Skin is closed, sterile dressings are applied.  The patient tolerated the procedure well  and was taken to the postanesthesia care unit in stable condition  Specimen: None  Complications: None  Estimated Blood Loss: 5 mL

## 2023-05-15 NOTE — Interval H&P Note (Signed)
 History and Physical Interval Note:  05/15/2023 10:14 AM  Johnathan Fowler  has presented today for surgery, with the diagnosis of K40.90 non recurrent unialteral inguinal hernia w/o obstruction or gangrene.  The various methods of treatment have been discussed with the patient and family. After consideration of risks, benefits and other options for treatment, the patient has consented to  Procedure(s): XI ROBOTIC ASSISTED BILATERAL INGUINAL HERNIA (Bilateral) as a surgical intervention.  The patient's history has been reviewed, patient examined, no change in status, stable for surgery.  I have reviewed the patient's chart and labs.  Questions were answered to the patient's satisfaction.     Carolan Shiver

## 2023-05-15 NOTE — Anesthesia Postprocedure Evaluation (Signed)
 Anesthesia Post Note  Patient: Johnathan Fowler  Procedure(s) Performed: XI ROBOTIC ASSISTED BILATERAL INGUINAL HERNIA (Bilateral: Inguinal) INSERTION OF MESH  Patient location during evaluation: PACU Anesthesia Type: General Level of consciousness: awake and alert Pain management: pain level controlled Vital Signs Assessment: post-procedure vital signs reviewed and stable Respiratory status: spontaneous breathing, nonlabored ventilation, respiratory function stable and patient connected to nasal cannula oxygen Cardiovascular status: blood pressure returned to baseline and stable Postop Assessment: no apparent nausea or vomiting Anesthetic complications: no  No notable events documented.   Last Vitals:  Vitals:   05/15/23 1230 05/15/23 1235  BP: (!) 142/83   Pulse: 73 61  Resp: 12 10  Temp:    SpO2: 98% 96%    Last Pain:  Vitals:   05/15/23 1230  TempSrc:   PainSc: 10-Worst pain ever                 [facility]

## 2023-05-15 NOTE — Transfer of Care (Signed)
 Immediate Anesthesia Transfer of Care Note  Patient: Johnathan Fowler  Procedure(s) Performed: XI ROBOTIC ASSISTED BILATERAL INGUINAL HERNIA (Bilateral: Inguinal) INSERTION OF MESH  Patient Location: PACU  Anesthesia Type:General  Level of Consciousness: drowsy  Airway & Oxygen Therapy: Patient Spontanous Breathing and Patient connected to face mask oxygen  Post-op Assessment: Report given to RN and Post -op Vital signs reviewed and stable  Post vital signs: Reviewed and stable  Last Vitals:  Vitals Value Taken Time  BP 173/91 05/15/23 1155  Temp    Pulse 85 05/15/23 1157  Resp 17 05/15/23 1157  SpO2 98 % 05/15/23 1157  Vitals shown include unfiled device data.  Last Pain:  Vitals:   05/15/23 0940  TempSrc: Temporal  PainSc: 0-No pain         Complications: No notable events documented.

## 2023-05-15 NOTE — Anesthesia Preprocedure Evaluation (Signed)
 Anesthesia Evaluation  Patient identified by MRN, date of birth, ID band Patient awake    Reviewed: Allergy & Precautions, NPO status , Patient's Chart, lab work & pertinent test results  Airway Mallampati: II  TM Distance: >3 FB Neck ROM: full    Dental  (+) Chipped, Dental Advidsory Given   Pulmonary neg pulmonary ROS, Current Smoker and Patient abstained from smoking.   Pulmonary exam normal        Cardiovascular negative cardio ROS Normal cardiovascular exam     Neuro/Psych negative neurological ROS  negative psych ROS   GI/Hepatic negative GI ROS, Neg liver ROS,,,  Endo/Other  negative endocrine ROS    Renal/GU      Musculoskeletal   Abdominal   Peds  Hematology negative hematology ROS (+)   Anesthesia Other Findings History reviewed. No pertinent past medical history.  Past Surgical History: No date: HEMORRHOID SURGERY  BMI    Body Mass Index: 15.90 kg/m      Reproductive/Obstetrics negative OB ROS                             Anesthesia Physical Anesthesia Plan  ASA: 3  Anesthesia Plan: General ETT   Post-op Pain Management:    Induction: Intravenous  PONV Risk Score and Plan: 2 and Midazolam  Airway Management Planned: Oral ETT  Additional Equipment:   Intra-op Plan:   Post-operative Plan: Extubation in OR  Informed Consent: I have reviewed the patients History and Physical, chart, labs and discussed the procedure including the risks, benefits and alternatives for the proposed anesthesia with the patient or authorized representative who has indicated his/her understanding and acceptance.     Dental Advisory Given  Plan Discussed with: Anesthesiologist, CRNA and Surgeon  Anesthesia Plan Comments: (Patient consented for risks of anesthesia including but not limited to:  - adverse reactions to medications - damage to eyes, teeth, lips or other oral  mucosa - nerve damage due to positioning  - sore throat or hoarseness - Damage to heart, brain, nerves, lungs, other parts of body or loss of life  Patient voiced understanding and assent.)       Anesthesia Quick Evaluation

## 2023-05-16 ENCOUNTER — Encounter: Payer: Self-pay | Admitting: General Surgery

## 2023-06-16 DIAGNOSIS — R634 Abnormal weight loss: Secondary | ICD-10-CM | POA: Diagnosis not present

## 2023-06-16 DIAGNOSIS — Z0131 Encounter for examination of blood pressure with abnormal findings: Secondary | ICD-10-CM | POA: Diagnosis not present

## 2023-06-16 DIAGNOSIS — Z0000 Encounter for general adult medical examination without abnormal findings: Secondary | ICD-10-CM | POA: Diagnosis not present

## 2023-06-16 DIAGNOSIS — K029 Dental caries, unspecified: Secondary | ICD-10-CM | POA: Diagnosis not present

## 2023-06-16 DIAGNOSIS — I10 Essential (primary) hypertension: Secondary | ICD-10-CM | POA: Diagnosis not present

## 2023-06-16 DIAGNOSIS — Z1389 Encounter for screening for other disorder: Secondary | ICD-10-CM | POA: Diagnosis not present

## 2023-06-16 DIAGNOSIS — Z0130 Encounter for examination of blood pressure without abnormal findings: Secondary | ICD-10-CM | POA: Diagnosis not present

## 2023-10-09 DIAGNOSIS — Z1389 Encounter for screening for other disorder: Secondary | ICD-10-CM | POA: Diagnosis not present

## 2023-10-09 DIAGNOSIS — I10 Essential (primary) hypertension: Secondary | ICD-10-CM | POA: Diagnosis not present

## 2023-10-09 DIAGNOSIS — Z0131 Encounter for examination of blood pressure with abnormal findings: Secondary | ICD-10-CM | POA: Diagnosis not present

## 2023-10-09 DIAGNOSIS — Z720 Tobacco use: Secondary | ICD-10-CM | POA: Diagnosis not present

## 2023-10-09 DIAGNOSIS — R634 Abnormal weight loss: Secondary | ICD-10-CM | POA: Diagnosis not present

## 2023-10-09 DIAGNOSIS — R531 Weakness: Secondary | ICD-10-CM | POA: Diagnosis not present

## 2023-10-09 DIAGNOSIS — K029 Dental caries, unspecified: Secondary | ICD-10-CM | POA: Diagnosis not present

## 2023-10-09 DIAGNOSIS — R42 Dizziness and giddiness: Secondary | ICD-10-CM | POA: Diagnosis not present

## 2023-10-09 DIAGNOSIS — R51 Headache with orthostatic component, not elsewhere classified: Secondary | ICD-10-CM | POA: Diagnosis not present

## 2023-10-09 DIAGNOSIS — F1099 Alcohol use, unspecified with unspecified alcohol-induced disorder: Secondary | ICD-10-CM | POA: Diagnosis not present

## 2023-11-06 DIAGNOSIS — Z1159 Encounter for screening for other viral diseases: Secondary | ICD-10-CM | POA: Diagnosis not present

## 2023-11-06 DIAGNOSIS — M10071 Idiopathic gout, right ankle and foot: Secondary | ICD-10-CM | POA: Diagnosis not present

## 2023-11-06 DIAGNOSIS — Z1389 Encounter for screening for other disorder: Secondary | ICD-10-CM | POA: Diagnosis not present

## 2023-11-06 DIAGNOSIS — F1099 Alcohol use, unspecified with unspecified alcohol-induced disorder: Secondary | ICD-10-CM | POA: Diagnosis not present

## 2023-11-06 DIAGNOSIS — Z0130 Encounter for examination of blood pressure without abnormal findings: Secondary | ICD-10-CM | POA: Diagnosis not present

## 2023-11-07 DIAGNOSIS — Z1159 Encounter for screening for other viral diseases: Secondary | ICD-10-CM | POA: Diagnosis not present

## 2023-11-07 DIAGNOSIS — M10071 Idiopathic gout, right ankle and foot: Secondary | ICD-10-CM | POA: Diagnosis not present

## 2023-11-09 ENCOUNTER — Encounter: Payer: Self-pay | Admitting: *Deleted

## 2023-11-28 DIAGNOSIS — Q828 Other specified congenital malformations of skin: Secondary | ICD-10-CM | POA: Diagnosis not present

## 2023-11-28 DIAGNOSIS — M79672 Pain in left foot: Secondary | ICD-10-CM | POA: Diagnosis not present
